# Patient Record
Sex: Male | Born: 1955 | Race: White | Hispanic: No | Marital: Married | Smoking: Never smoker
Health system: Southern US, Community
[De-identification: ages and names within clinical notes are randomized; demographics above are authoritative.]

## PROBLEM LIST (undated history)

## (undated) DIAGNOSIS — N400 Enlarged prostate without lower urinary tract symptoms: Secondary | ICD-10-CM

## (undated) DIAGNOSIS — R351 Nocturia: Secondary | ICD-10-CM

## (undated) DIAGNOSIS — I10 Essential (primary) hypertension: Secondary | ICD-10-CM

## (undated) DIAGNOSIS — N320 Bladder-neck obstruction: Secondary | ICD-10-CM

## (undated) DIAGNOSIS — I719 Aortic aneurysm of unspecified site, without rupture: Secondary | ICD-10-CM

## (undated) DIAGNOSIS — E7800 Pure hypercholesterolemia, unspecified: Secondary | ICD-10-CM

## (undated) DIAGNOSIS — R3915 Urgency of urination: Secondary | ICD-10-CM

## (undated) DIAGNOSIS — R350 Frequency of micturition: Secondary | ICD-10-CM

## (undated) DIAGNOSIS — N401 Enlarged prostate with lower urinary tract symptoms: Secondary | ICD-10-CM

## (undated) DIAGNOSIS — N138 Other obstructive and reflux uropathy: Secondary | ICD-10-CM

## (undated) HISTORY — DX: Essential (primary) hypertension: I10

---

## 1990-10-01 HISTORY — PX: LUMBAR DISC SURGERY: SHX700

## 2006-11-22 ENCOUNTER — Ambulatory Visit (HOSPITAL_COMMUNITY): Admission: RE | Admit: 2006-11-22 | Discharge: 2006-11-22 | Payer: Self-pay | Admitting: Internal Medicine

## 2006-11-22 ENCOUNTER — Ambulatory Visit (HOSPITAL_COMMUNITY): Admission: RE | Admit: 2006-11-22 | Discharge: 2006-11-22 | Payer: Self-pay | Admitting: *Deleted

## 2007-01-15 ENCOUNTER — Ambulatory Visit (HOSPITAL_COMMUNITY): Admission: RE | Admit: 2007-01-15 | Discharge: 2007-01-15 | Payer: Self-pay | Admitting: Orthopedic Surgery

## 2010-10-22 ENCOUNTER — Encounter: Payer: Self-pay | Admitting: Orthopedic Surgery

## 2013-01-09 ENCOUNTER — Ambulatory Visit (INDEPENDENT_AMBULATORY_CARE_PROVIDER_SITE_OTHER): Payer: 59 | Admitting: Urology

## 2013-01-09 DIAGNOSIS — R351 Nocturia: Secondary | ICD-10-CM

## 2013-01-09 DIAGNOSIS — N401 Enlarged prostate with lower urinary tract symptoms: Secondary | ICD-10-CM

## 2013-01-09 DIAGNOSIS — R350 Frequency of micturition: Secondary | ICD-10-CM

## 2013-03-30 ENCOUNTER — Other Ambulatory Visit: Payer: Self-pay | Admitting: Urology

## 2013-05-29 ENCOUNTER — Ambulatory Visit (INDEPENDENT_AMBULATORY_CARE_PROVIDER_SITE_OTHER): Payer: 59 | Admitting: Urology

## 2013-05-29 DIAGNOSIS — N401 Enlarged prostate with lower urinary tract symptoms: Secondary | ICD-10-CM

## 2013-06-02 ENCOUNTER — Encounter (HOSPITAL_BASED_OUTPATIENT_CLINIC_OR_DEPARTMENT_OTHER): Payer: Self-pay | Admitting: *Deleted

## 2013-06-02 NOTE — Progress Notes (Signed)
NPO AFTER MN. ARRIVES AT 0800. NEEDS HG.

## 2013-06-02 NOTE — Progress Notes (Deleted)
06/02/13 1546  OBSTRUCTIVE SLEEP APNEA  Have you ever been diagnosed with sleep apnea through a sleep study? No  Do you snore loudly (loud enough to be heard through closed doors)?  1  Do you often feel tired, fatigued, or sleepy during the daytime? 1  Has anyone observed you stop breathing during your sleep? 0  Do you have, or are you being treated for high blood pressure? 0  BMI more than 35 kg/m2? 0  Age over 57 years old? 1  Neck circumference greater than 40 cm/18 inches? 0  Gender: 1  Obstructive Sleep Apnea Score 4   

## 2013-06-09 NOTE — H&P (Signed)
  ive Problems 1. Benign Prostatic Hypertrophy With Urinary Obstruction 600.01 2. Incomplete Emptying Of Bladder 788.21 3. Nocturia 788.43 4. Organic Impotence 607.84 5. Urinary Frequency 788.41  History of Present Illness  [name] [name] returns today in f/u for a preop visit prior to [facility] prostatectomy scheduled for 9/11.   He has had no hematuria or dysuria.   His voiding complaints are stable.   Past Medical History 1. History of  No Medical Problems  Surgical History 1. History of  Back Surgery  Current Meds 1. No Reported Medications  Allergies 1. No Known Drug Allergies  Family History 1. Family history of  Family Health Status Number Of Children  Social History 1. Alcohol Use wine 2. Caffeine Use 2 cups 3. Marital History - Currently Married 4. Never A Smoker 5. Occupation: self employed Scientist, product/process development, constitutional, skin, eye, otolaryngeal, hematologic/lymphatic, cardiovascular, pulmonary, endocrine, musculoskeletal, gastrointestinal, neurological and psychiatric system(s) were reviewed and pertinent findings if present are noted.  Cardiovascular: no chest pain.  Respiratory: no shortness of breath.    Vitals Vital Signs [Data Includes: Last 1 Day]  29Aug2014 09:44AM  Blood Pressure: 139 / 84 Temperature: 98.5 F Heart Rate: 56  Physical Exam Constitutional: Well nourished and well developed . No acute distress.  Pulmonary: No respiratory distress and normal respiratory rhythm and effort.  Cardiovascular: Heart rate and rhythm are normal . No peripheral edema.    Results/Data Urine [Data Includes: Last 1 Day]   29Aug2014  COLOR YELLOW   APPEARANCE CLEAR   SPECIFIC GRAVITY 1.020   pH 6.5   GLUCOSE NEG mg/dL  BILIRUBIN NEG   KETONE NEG mg/dL  BLOOD NEG   PROTEIN NEG mg/dL  UROBILINOGEN 0.2 mg/dL  NITRITE NEG   LEUKOCYTE ESTERASE NEG    Assessment 1. Benign Prostatic Hypertrophy With Urinary Obstruction 600.01 2.  Incomplete Emptying Of Bladder 788.21   He has stable voiding symptoms and is ready to proceed with the Greenlight procedure.   Plan Organic Impotence (607.84)  1. UA With REFLEX  Done: 29Aug2014 09:53AM   I reviewed the procedure and side effects and answered his questions to his satisfaction.  Surgery is scheduled for 9/11.

## 2013-06-11 ENCOUNTER — Ambulatory Visit (HOSPITAL_BASED_OUTPATIENT_CLINIC_OR_DEPARTMENT_OTHER): Payer: 59 | Admitting: Anesthesiology

## 2013-06-11 ENCOUNTER — Ambulatory Visit (HOSPITAL_BASED_OUTPATIENT_CLINIC_OR_DEPARTMENT_OTHER)
Admission: RE | Admit: 2013-06-11 | Discharge: 2013-06-11 | Disposition: A | Discharge status: Home / Self Care | Payer: 59 | Source: Ambulatory Visit | Attending: Urology | Admitting: Urology

## 2013-06-11 ENCOUNTER — Encounter (HOSPITAL_BASED_OUTPATIENT_CLINIC_OR_DEPARTMENT_OTHER)
Admission: RE | Disposition: A | Discharge status: Home / Self Care | Payer: Self-pay | Source: Ambulatory Visit | Attending: Urology

## 2013-06-11 ENCOUNTER — Encounter (HOSPITAL_BASED_OUTPATIENT_CLINIC_OR_DEPARTMENT_OTHER): Payer: Self-pay | Admitting: Anesthesiology

## 2013-06-11 DIAGNOSIS — R339 Retention of urine, unspecified: Secondary | ICD-10-CM | POA: Insufficient documentation

## 2013-06-11 DIAGNOSIS — N320 Bladder-neck obstruction: Secondary | ICD-10-CM | POA: Insufficient documentation

## 2013-06-11 DIAGNOSIS — N138 Other obstructive and reflux uropathy: Secondary | ICD-10-CM | POA: Insufficient documentation

## 2013-06-11 DIAGNOSIS — N529 Male erectile dysfunction, unspecified: Secondary | ICD-10-CM | POA: Insufficient documentation

## 2013-06-11 DIAGNOSIS — R351 Nocturia: Secondary | ICD-10-CM | POA: Insufficient documentation

## 2013-06-11 DIAGNOSIS — N401 Enlarged prostate with lower urinary tract symptoms: Secondary | ICD-10-CM

## 2013-06-11 DIAGNOSIS — R350 Frequency of micturition: Secondary | ICD-10-CM | POA: Insufficient documentation

## 2013-06-11 HISTORY — DX: Benign prostatic hyperplasia with lower urinary tract symptoms: N40.1

## 2013-06-11 HISTORY — DX: Urgency of urination: R39.15

## 2013-06-11 HISTORY — DX: Bladder-neck obstruction: N32.0

## 2013-06-11 HISTORY — DX: Benign prostatic hyperplasia without lower urinary tract symptoms: N40.0

## 2013-06-11 HISTORY — DX: Frequency of micturition: R35.0

## 2013-06-11 HISTORY — PX: GREEN LIGHT LASER TURP (TRANSURETHRAL RESECTION OF PROSTATE: SHX6260

## 2013-06-11 HISTORY — DX: Other obstructive and reflux uropathy: N13.8

## 2013-06-11 HISTORY — DX: Nocturia: R35.1

## 2013-06-11 SURGERY — GREEN LIGHT LASER TURP (TRANSURETHRAL RESECTION OF PROSTATE
Anesthesia: General | Site: Prostate | Wound class: Clean Contaminated

## 2013-06-11 MED ORDER — SODIUM CHLORIDE 0.9 % IR SOLN
Status: DC | PRN
  Administered 2013-06-11: 9000 mL

## 2013-06-11 MED ORDER — LIDOCAINE HCL (CARDIAC) 20 MG/ML IV SOLN
INTRAVENOUS | Status: DC | PRN
  Administered 2013-06-11: 100 mg via INTRAVENOUS

## 2013-06-11 MED ORDER — PROPOFOL 10 MG/ML IV BOLUS
INTRAVENOUS | Status: DC | PRN
  Administered 2013-06-11: 200 mg via INTRAVENOUS

## 2013-06-11 MED ORDER — FENTANYL CITRATE 0.05 MG/ML IJ SOLN
INTRAMUSCULAR | Status: DC | PRN
  Administered 2013-06-11 (×2): 50 ug via INTRAVENOUS

## 2013-06-11 MED ORDER — CIPROFLOXACIN IN D5W 400 MG/200ML IV SOLN
400.0000 mg | INTRAVENOUS | Status: AC
  Administered 2013-06-11: 400 mg via INTRAVENOUS
  Filled 2013-06-11: qty 200

## 2013-06-11 MED ORDER — SODIUM CHLORIDE 0.9 % IR SOLN
Status: DC | PRN
  Administered 2013-06-11: 1000 mL

## 2013-06-11 MED ORDER — FENTANYL CITRATE 0.05 MG/ML IJ SOLN
25.0000 ug | INTRAMUSCULAR | Status: DC | PRN
  Filled 2013-06-11: qty 1

## 2013-06-11 MED ORDER — SODIUM CHLORIDE 0.9 % IJ SOLN
3.0000 mL | INTRAMUSCULAR | Status: DC | PRN
  Filled 2013-06-11: qty 3

## 2013-06-11 MED ORDER — ONDANSETRON HCL 4 MG/2ML IJ SOLN
4.0000 mg | Freq: Four times a day (QID) | INTRAMUSCULAR | Status: DC | PRN
  Filled 2013-06-11: qty 2

## 2013-06-11 MED ORDER — LACTATED RINGERS IV SOLN
INTRAVENOUS | Status: DC
  Administered 2013-06-11: 09:00:00 via INTRAVENOUS
  Filled 2013-06-11: qty 1000

## 2013-06-11 MED ORDER — MIDAZOLAM HCL 5 MG/5ML IJ SOLN
INTRAMUSCULAR | Status: DC | PRN
  Administered 2013-06-11: 2 mg via INTRAVENOUS

## 2013-06-11 MED ORDER — ACETAMINOPHEN 325 MG PO TABS
650.0000 mg | ORAL_TABLET | ORAL | Status: DC | PRN
  Filled 2013-06-11: qty 2

## 2013-06-11 MED ORDER — SODIUM CHLORIDE 0.9 % IV SOLN
250.0000 mL | INTRAVENOUS | Status: DC | PRN
  Filled 2013-06-11: qty 250

## 2013-06-11 MED ORDER — ACETAMINOPHEN 650 MG RE SUPP
650.0000 mg | RECTAL | Status: DC | PRN
  Filled 2013-06-11: qty 1

## 2013-06-11 MED ORDER — SODIUM CHLORIDE 0.9 % IJ SOLN
3.0000 mL | Freq: Two times a day (BID) | INTRAMUSCULAR | Status: DC
  Filled 2013-06-11: qty 3

## 2013-06-11 MED ORDER — OXYCODONE HCL 5 MG PO TABS
5.0000 mg | ORAL_TABLET | ORAL | Status: DC | PRN
Start: 2013-06-11 — End: 2013-06-11
  Filled 2013-06-11: qty 2

## 2013-06-11 MED ORDER — CIPROFLOXACIN HCL 500 MG PO TABS: 500.0000 mg | ORAL_TABLET | Freq: Two times a day (BID) | ORAL | Status: DC

## 2013-06-11 MED ORDER — BELLADONNA ALKALOIDS-OPIUM 16.2-60 MG RE SUPP
RECTAL | Status: DC | PRN
  Administered 2013-06-11: 1 via RECTAL

## 2013-06-11 MED ORDER — HYDROCODONE-ACETAMINOPHEN 5-325 MG PO TABS: 1.0000 | ORAL_TABLET | Freq: Four times a day (QID) | ORAL | Status: DC | PRN

## 2013-06-11 MED ORDER — LACTATED RINGERS IV SOLN
INTRAVENOUS | Status: DC
  Filled 2013-06-11: qty 1000

## 2013-06-11 MED ORDER — ONDANSETRON HCL 4 MG/2ML IJ SOLN
INTRAMUSCULAR | Status: DC | PRN
  Administered 2013-06-11: 4 mg via INTRAVENOUS

## 2013-06-11 MED ORDER — GLYCOPYRROLATE 0.2 MG/ML IJ SOLN
INTRAMUSCULAR | Status: DC | PRN
  Administered 2013-06-11: 0.2 mg via INTRAVENOUS

## 2013-06-11 MED ORDER — DEXAMETHASONE SODIUM PHOSPHATE 4 MG/ML IJ SOLN
INTRAMUSCULAR | Status: DC | PRN
  Administered 2013-06-11: 10 mg via INTRAVENOUS

## 2013-06-11 SURGICAL SUPPLY — 25 items
BAG URINE DRAINAGE (UROLOGICAL SUPPLIES) IMPLANT
BAG URINE LEG 500ML (DRAIN) ×2 IMPLANT
BAG URO CATCHER STRL LF (DRAPE) ×2 IMPLANT
CANISTER SUCT LVC 12 LTR MEDI- (MISCELLANEOUS) ×2 IMPLANT
CATH FOLEY 2WAY SLVR 30CC 22FR (CATHETERS) ×2 IMPLANT
CLOTH BEACON ORANGE TIMEOUT ST (SAFETY) ×2 IMPLANT
DRAPE CAMERA CLOSED 9X96 (DRAPES) ×2 IMPLANT
ELECT BUTTON HF 24-28F 2 30DE (ELECTRODE) IMPLANT
ELECT LOOP MED HF 24F 12D (CUTTING LOOP) IMPLANT
ELECT LOOP MED HF 24F 12D CBL (CLIP) IMPLANT
ELECT RESECT VAPORIZE 12D CBL (ELECTRODE) IMPLANT
GLOVE BIOGEL PI IND STRL 6.5 (GLOVE) ×1 IMPLANT
GLOVE BIOGEL PI INDICATOR 6.5 (GLOVE) ×1
GLOVE ECLIPSE 6.5 STRL STRAW (GLOVE) ×2 IMPLANT
GLOVE SURG SS PI 8.0 STRL IVOR (GLOVE) ×2 IMPLANT
GOWN STRL REIN XL XLG (GOWN DISPOSABLE) ×2 IMPLANT
HOLDER FOLEY CATH W/STRAP (MISCELLANEOUS) IMPLANT
IV NS 1000ML (IV SOLUTION) ×1
IV NS 1000ML BAXH (IV SOLUTION) ×1 IMPLANT
IV NS IRRIG 3000ML ARTHROMATIC (IV SOLUTION) ×6 IMPLANT
LASER FIBER /GREENLIGHT LASER (Laser) ×2 IMPLANT
LASER GREENLIGHT RENTAL P/PROC (Laser) ×2 IMPLANT
PACK CYSTOSCOPY (CUSTOM PROCEDURE TRAY) ×2 IMPLANT
SYR 30ML LL (SYRINGE) IMPLANT
SYRINGE IRR TOOMEY STRL 70CC (SYRINGE) IMPLANT

## 2013-06-11 NOTE — Anesthesia Postprocedure Evaluation (Signed)
  Anesthesia Post-op Note  Patient: [name] [name]  Procedure(s) Performed: Procedure(s) (LRB): GREEN LIGHT LASER TURP (TRANSURETHRAL RESECTION OF PROSTATE (N/A)  Patient Location: PACU  Anesthesia Type: General  Level of Consciousness: awake and alert   Airway and Oxygen Therapy: Patient Spontanous Breathing  Post-op Pain: mild  Post-op Assessment: Post-op Vital signs reviewed, Patient's Cardiovascular Status Stable, Respiratory Function Stable, Patent Airway and No signs of Nausea or vomiting  Last Vitals:  Filed Vitals:   06/11/13 1043  BP: 132/83  Pulse: 57  Temp: 36.1 C  Resp: 9    Post-op Vital Signs: stable   Complications: No apparent anesthesia complications

## 2013-06-11 NOTE — Brief Op Note (Signed)
06/11/2013  10:29 AM  PATIENT:  [name] [name]  57 y.o. male  PRE-OPERATIVE DIAGNOSIS:  BPH WITH BLADDER OUTLET OBSTRUCTION HYPERTROPHY  POST-OPERATIVE DIAGNOSIS:  BPH WITH BLADDER OUTLET OBSTRUCTION HYPERTROPHY  PROCEDURE:  Procedure(s): GREEN LIGHT LASER TURP (TRANSURETHRAL RESECTION OF PROSTATE (N/A) 18.19 min of laser time with 16109 joules applied.   SURGEON:  Surgeon(s) and Role:    * Anner Crete, MD - Primary  PHYSICIAN ASSISTANT:   ASSISTANTS: none   ANESTHESIA:   general  EBL:  Total I/O In: 200 [I.V.:200] Out: -   BLOOD ADMINISTERED:none  DRAINS: Urinary Catheter (Foley)   LOCAL MEDICATIONS USED:  NONE  SPECIMEN:  No Specimen  DISPOSITION OF SPECIMEN:  N/A  COUNTS:  YES  TOURNIQUET:  * No tourniquets in log *  DICTATION: .Other Dictation: Dictation Number [id number]  PLAN OF CARE: Discharge to home after PACU  PATIENT DISPOSITION:  PACU - hemodynamically stable.   Delay start of Pharmacological VTE agent (>24hrs) due to surgical blood loss or risk of bleeding: not applicable

## 2013-06-11 NOTE — Progress Notes (Signed)
06/02/13 1546  OBSTRUCTIVE SLEEP APNEA  Have you ever been diagnosed with sleep apnea through a sleep study? No  Do you snore loudly (loud enough to be heard through closed doors)?  1  Do you often feel tired, fatigued, or sleepy during the daytime? 1  Has anyone observed you stop breathing during your sleep? 0  Do you have, or are you being treated for high blood pressure? 0  BMI more than 35 kg/m2? 0  Age over 57 years old? 1  Neck circumference greater than 40 cm/18 inches? 0  Gender: 1  Obstructive Sleep Apnea Score 4

## 2013-06-11 NOTE — Op Note (Deleted)
NAME:  [name], [name]               ACCOUNT NO.:  [id number]  MEDICAL RECORD NO.:  [id number]  LOCATION:                                 FACILITY:  PHYSICIAN:  [name] [name] J. [name] [name], M.D.    DATE OF BIRTH:  06/30/1956  DATE OF PROCEDURE:  06/11/2013 DATE OF DISCHARGE:  06/11/2013                              OPERATIVE REPORT   The patient of Dr. Everett [name].  PROCEDURE:  GreenLight laser prostatectomy.  PREOPERATIVE DIAGNOSIS:  Benign prostatic hypertrophy with bladder outlet obstruction.  POSTOPERATIVE DIAGNOSIS:  Benign prostatic hypertrophy with bladder outlet obstruction.  SURGEON:  [name] [name] J. [name] [name], M.D.  ANESTHESIA:  General.  SPECIMEN:  None.  DRAINS:  A 22-French Foley catheter.  COMPLICATIONS:  None.  INDICATIONS:  [name] [name] is a 56-year-old white male with BPH, bladder outlet obstruction, who has elected to undergo green light laser prostatectomy for relief of symptoms, findings, and procedures.  He was given Cipro.  He was taken to the operating room where general anesthetic was induced.  He was placed in lithotomy position.  His perineum and genitalia were prepped with Betadine solution.  He was draped in usual sterile fashion.  The 24-French laser scope was inserted with a 12-degree lens. Inspection revealed normal urethra.  The external sphincter was intact. The prostatic urethra had trilobar hyperplasia with a moderately large middle lobe and bilobar coaptation.  Prostatic length was approximately 3-4 cm.  Inspection of the bladder revealed mild trabeculation without tumors, stones, or inflammation.  Ureteral orifices were difficult to visualize behind the middle lobe, but were eventually found.  Once inspection had been performed, the GreenLight laser fiber was flushed and inserted, and treatment was initiated at a power setting of 80 watts.  The middle lobe was initially treated most proximally with the 80 watt setting to define the bladder neck.  I then  increased the power to 120 watts and then ablated the middle lobe down to the bladder neck fibers.  The floor of the prostate was then ablated out to alongside the verumontanum.  The right lobe of the prostate was then approached.  The setting was changed back to 80, and the bladder neck was defined.  The right lobe was then ablated to create an adequate channel on the right out to the apex where great care was taken to avoid going to distally.  The left lobe was then managed in identical fashion.  Once an adequate channel was created.  Inspection revealed some residual anterior tissue, which was ablated on the 80 watt setting.  Throughout the procedure, occasional bleeders required management with the coag setting.  The final approach was to further flatten and carve out the floor of the prostate gland to ensure an adequate channel.  Once the procedure was complete, hemostasis was achieved.  Inspection revealed intact ureteral orifices.  No debris in the bladder and no active bleeding.  A 22-French Foley catheter was in place with the aid of a catheter guide.  The balloon was filled with 30 mL of sterile fluid.  The catheter was irrigated with clear return and placed to straight drainage.  The patient was taken down from lithotomy position.    His anesthetic was reversed.  He was moved to recovery room in stable condition.  He did have a B and O suppository at beginning of procedure.     [name] [name] [name] [name], M.D.     JJW/MEDQ  D:  06/11/2013  T:  06/11/2013  Job:  [id number] 

## 2013-06-11 NOTE — Anesthesia Procedure Notes (Signed)
Procedure Name: LMA Insertion Date/Time: 06/11/2013 9:31 AM Performed by: Norva Pavlov Pre-anesthesia Checklist: Patient identified, Emergency Drugs available, Suction available and Patient being monitored Patient Re-evaluated:Patient Re-evaluated prior to inductionOxygen Delivery Method: Circle System Utilized Preoxygenation: Pre-oxygenation with 100% oxygen Intubation Type: IV induction Ventilation: Mask ventilation without difficulty LMA: LMA inserted LMA Size: 5.0 Number of attempts: 1 Airway Equipment and Method: bite block Placement Confirmation: positive ETCO2 Tube secured with: Tape Dental Injury: Teeth and Oropharynx as per pre-operative assessment

## 2013-06-11 NOTE — Interval H&P Note (Signed)
History and Physical Interval Note:  06/11/2013 9:22 AM  [name] [name]  has presented today for surgery, with the diagnosis of BPH WITH BLADDER OUTLET OBSTRUCTION HYPERTROPHY  The various methods of treatment have been discussed with the patient and family. After consideration of risks, benefits and other options for treatment, the patient has consented to  Procedure(s): GREEN LIGHT LASER TURP (TRANSURETHRAL RESECTION OF PROSTATE (N/A) as a surgical intervention .  The patient's history has been reviewed, patient examined, no change in status, stable for surgery.  I have reviewed the patient's chart and labs.  Questions were answered to the patient's satisfaction.     Kennley Schwandt J

## 2013-06-11 NOTE — Progress Notes (Deleted)
06/11/13 0834  OBSTRUCTIVE SLEEP APNEA  Have you ever been diagnosed with sleep apnea through a sleep study? No  Do you snore loudly (loud enough to be heard through closed doors)?  1  Do you often feel tired, fatigued, or sleepy during the daytime? 0  Has anyone observed you stop breathing during your sleep? 1  Do you have, or are you being treated for high blood pressure? 0  BMI more than 35 kg/m2? 0  Age over 57 years old? 1  Neck circumference greater than 40 cm/18 inches? 0  Gender: 1  Obstructive Sleep Apnea Score 4  Score 4 or greater  Results sent to PCP

## 2013-06-11 NOTE — Op Note (Signed)
NAMEYUAN, GANN               ACCOUNT NO.:  [id number]  MEDICAL RECORD NO.:  [id number]  LOCATION:                                 FACILITY:  PHYSICIAN:  Excell Seltzer. Annabell Howells, M.D.    DATE OF BIRTH:  30-Aug-1956  DATE OF PROCEDURE:  06/11/2013 DATE OF DISCHARGE:  06/11/2013                              OPERATIVE REPORT   The patient of Dr. Bjorn Pippin.  PROCEDURE:  GreenLight laser prostatectomy.  PREOPERATIVE DIAGNOSIS:  Benign prostatic hypertrophy with bladder outlet obstruction.  POSTOPERATIVE DIAGNOSIS:  Benign prostatic hypertrophy with bladder outlet obstruction.  SURGEON:  Excell Seltzer. Annabell Howells, M.D.  ANESTHESIA:  General.  SPECIMEN:  None.  DRAINS:  A 22-French Foley catheter.  COMPLICATIONS:  None.  INDICATIONS:  Mr. Gains is a 57 year old white male with BPH, bladder outlet obstruction, who has elected to undergo green light laser prostatectomy for relief of symptoms, findings, and procedures.  He was given Cipro.  He was taken to the operating room where general anesthetic was induced.  He was placed in lithotomy position.  His perineum and genitalia were prepped with Betadine solution.  He was draped in usual sterile fashion.  The 24-French laser scope was inserted with a 12-degree lens. Inspection revealed normal urethra.  The external sphincter was intact. The prostatic urethra had trilobar hyperplasia with a moderately large middle lobe and bilobar coaptation.  Prostatic length was approximately 3-4 cm.  Inspection of the bladder revealed mild trabeculation without tumors, stones, or inflammation.  Ureteral orifices were difficult to visualize behind the middle lobe, but were eventually found.  Once inspection had been performed, the GreenLight laser fiber was flushed and inserted, and treatment was initiated at a power setting of 80 watts.  The middle lobe was initially treated most proximally with the 80 watt setting to define the bladder neck.  I then  increased the power to 120 watts and then ablated the middle lobe down to the bladder neck fibers.  The floor of the prostate was then ablated out to alongside the verumontanum.  The right lobe of the prostate was then approached.  The setting was changed back to 80, and the bladder neck was defined.  The right lobe was then ablated to create an adequate channel on the right out to the apex where great care was taken to avoid going to distally.  The left lobe was then managed in identical fashion.  Once an adequate channel was created.  Inspection revealed some residual anterior tissue, which was ablated on the 80 watt setting.  Throughout the procedure, occasional bleeders required management with the coag setting.  The final approach was to further flatten and carve out the floor of the prostate gland to ensure an adequate channel.  Once the procedure was complete, hemostasis was achieved.  Inspection revealed intact ureteral orifices.  No debris in the bladder and no active bleeding.  A 22-French Foley catheter was in place with the aid of a catheter guide.  The balloon was filled with 30 mL of sterile fluid.  The catheter was irrigated with clear return and placed to straight drainage.  The patient was taken down from lithotomy position.  His anesthetic was reversed.  He was moved to recovery room in stable condition.  He did have a B and O suppository at beginning of procedure.     Excell Seltzer. Annabell Howells, M.D.     JJW/MEDQ  D:  06/11/2013  T:  06/11/2013  Job:  [id number]

## 2013-06-11 NOTE — Anesthesia Preprocedure Evaluation (Signed)

## 2013-06-11 NOTE — Transfer of Care (Signed)
Immediate Anesthesia Transfer of Care Note  Patient: [name] [name]  Procedure(s) Performed: Procedure(s) (LRB): GREEN LIGHT LASER TURP (TRANSURETHRAL RESECTION OF PROSTATE (N/A)  Patient Location: PACU  Anesthesia Type: General  Level of Consciousness: awake, alert  and oriented  Airway & Oxygen Therapy: Patient Spontanous Breathing and Patient connected to face mask oxygen  Post-op Assessment: Report given to PACU RN and Post -op Vital signs reviewed and stable  Post vital signs: Reviewed and stable  Complications: No apparent anesthesia complications

## 2013-06-12 ENCOUNTER — Encounter (HOSPITAL_BASED_OUTPATIENT_CLINIC_OR_DEPARTMENT_OTHER): Payer: Self-pay | Admitting: Urology

## 2013-06-12 LAB — POCT HEMOGLOBIN-HEMACUE: Hemoglobin: 15.5 g/dL (ref 13.0–17.0)

## 2016-01-18 DIAGNOSIS — Z125 Encounter for screening for malignant neoplasm of prostate: Secondary | ICD-10-CM | POA: Diagnosis not present

## 2016-01-18 DIAGNOSIS — R5383 Other fatigue: Secondary | ICD-10-CM | POA: Diagnosis not present

## 2016-01-18 DIAGNOSIS — Z131 Encounter for screening for diabetes mellitus: Secondary | ICD-10-CM | POA: Diagnosis not present

## 2016-01-18 DIAGNOSIS — E291 Testicular hypofunction: Secondary | ICD-10-CM | POA: Diagnosis not present

## 2016-01-18 DIAGNOSIS — E559 Vitamin D deficiency, unspecified: Secondary | ICD-10-CM | POA: Diagnosis not present

## 2016-01-18 DIAGNOSIS — Z0389 Encounter for observation for other suspected diseases and conditions ruled out: Secondary | ICD-10-CM | POA: Diagnosis not present

## 2016-01-18 DIAGNOSIS — Z0000 Encounter for general adult medical examination without abnormal findings: Secondary | ICD-10-CM | POA: Diagnosis not present

## 2016-01-18 DIAGNOSIS — Z13220 Encounter for screening for lipoid disorders: Secondary | ICD-10-CM | POA: Diagnosis not present

## 2016-02-21 DIAGNOSIS — E559 Vitamin D deficiency, unspecified: Secondary | ICD-10-CM | POA: Diagnosis not present

## 2016-02-21 DIAGNOSIS — E291 Testicular hypofunction: Secondary | ICD-10-CM | POA: Diagnosis not present

## 2016-02-21 DIAGNOSIS — R5383 Other fatigue: Secondary | ICD-10-CM | POA: Diagnosis not present

## 2016-04-17 DIAGNOSIS — R5383 Other fatigue: Secondary | ICD-10-CM | POA: Diagnosis not present

## 2016-04-17 DIAGNOSIS — E291 Testicular hypofunction: Secondary | ICD-10-CM | POA: Diagnosis not present

## 2016-07-17 DIAGNOSIS — Z23 Encounter for immunization: Secondary | ICD-10-CM | POA: Diagnosis not present

## 2016-08-16 DIAGNOSIS — E785 Hyperlipidemia, unspecified: Secondary | ICD-10-CM | POA: Diagnosis not present

## 2016-08-16 DIAGNOSIS — E291 Testicular hypofunction: Secondary | ICD-10-CM | POA: Diagnosis not present

## 2016-08-16 DIAGNOSIS — Z125 Encounter for screening for malignant neoplasm of prostate: Secondary | ICD-10-CM | POA: Diagnosis not present

## 2016-08-16 DIAGNOSIS — E559 Vitamin D deficiency, unspecified: Secondary | ICD-10-CM | POA: Diagnosis not present

## 2016-08-16 DIAGNOSIS — Z713 Dietary counseling and surveillance: Secondary | ICD-10-CM | POA: Diagnosis not present

## 2016-08-16 DIAGNOSIS — Z13220 Encounter for screening for lipoid disorders: Secondary | ICD-10-CM | POA: Diagnosis not present

## 2016-11-01 MED FILL — AMOXICILLIN 875 MG TABLET: 875 | 7 days supply | Qty: 14 | Fill #0

## 2016-11-07 MED FILL — CLINDAMYCIN HCL 300 MG CAPS: 300 | 10 days supply | Qty: 30 | Fill #0

## 2016-11-22 DIAGNOSIS — Z7689 Persons encountering health services in other specified circumstances: Secondary | ICD-10-CM | POA: Diagnosis not present

## 2016-12-04 DIAGNOSIS — Z8249 Family history of ischemic heart disease and other diseases of the circulatory system: Secondary | ICD-10-CM | POA: Diagnosis not present

## 2016-12-04 DIAGNOSIS — Z23 Encounter for immunization: Secondary | ICD-10-CM | POA: Diagnosis not present

## 2016-12-04 DIAGNOSIS — Z0000 Encounter for general adult medical examination without abnormal findings: Secondary | ICD-10-CM | POA: Diagnosis not present

## 2016-12-04 DIAGNOSIS — H6123 Impacted cerumen, bilateral: Secondary | ICD-10-CM | POA: Diagnosis not present

## 2016-12-04 DIAGNOSIS — Z1212 Encounter for screening for malignant neoplasm of rectum: Secondary | ICD-10-CM | POA: Diagnosis not present

## 2016-12-04 DIAGNOSIS — N401 Enlarged prostate with lower urinary tract symptoms: Secondary | ICD-10-CM | POA: Diagnosis not present

## 2016-12-11 DIAGNOSIS — E291 Testicular hypofunction: Secondary | ICD-10-CM | POA: Diagnosis not present

## 2016-12-11 DIAGNOSIS — Z1211 Encounter for screening for malignant neoplasm of colon: Secondary | ICD-10-CM | POA: Diagnosis not present

## 2016-12-11 DIAGNOSIS — Z1212 Encounter for screening for malignant neoplasm of rectum: Secondary | ICD-10-CM | POA: Diagnosis not present

## 2017-04-15 DIAGNOSIS — E291 Testicular hypofunction: Secondary | ICD-10-CM | POA: Diagnosis not present

## 2017-04-15 DIAGNOSIS — Z125 Encounter for screening for malignant neoplasm of prostate: Secondary | ICD-10-CM | POA: Diagnosis not present

## 2017-04-15 DIAGNOSIS — E559 Vitamin D deficiency, unspecified: Secondary | ICD-10-CM | POA: Diagnosis not present

## 2017-04-15 DIAGNOSIS — R5383 Other fatigue: Secondary | ICD-10-CM | POA: Diagnosis not present

## 2017-08-13 DIAGNOSIS — H524 Presbyopia: Secondary | ICD-10-CM | POA: Diagnosis not present

## 2017-08-13 DIAGNOSIS — H5203 Hypermetropia, bilateral: Secondary | ICD-10-CM | POA: Diagnosis not present

## 2017-08-13 DIAGNOSIS — H52223 Regular astigmatism, bilateral: Secondary | ICD-10-CM | POA: Diagnosis not present

## 2017-10-16 DIAGNOSIS — R5383 Other fatigue: Secondary | ICD-10-CM | POA: Diagnosis not present

## 2017-10-16 DIAGNOSIS — R972 Elevated prostate specific antigen [PSA]: Secondary | ICD-10-CM | POA: Diagnosis not present

## 2017-10-30 DIAGNOSIS — R972 Elevated prostate specific antigen [PSA]: Secondary | ICD-10-CM | POA: Diagnosis not present

## 2017-11-06 MED FILL — AMOXICILLIN 500 MG CAPSULE: 500 | 7 days supply | Qty: 28 | Fill #0

## 2018-03-24 DIAGNOSIS — L0390 Cellulitis, unspecified: Secondary | ICD-10-CM | POA: Diagnosis not present

## 2018-04-16 DIAGNOSIS — R972 Elevated prostate specific antigen [PSA]: Secondary | ICD-10-CM | POA: Diagnosis not present

## 2018-04-16 DIAGNOSIS — E291 Testicular hypofunction: Secondary | ICD-10-CM | POA: Diagnosis not present

## 2018-10-20 MED FILL — AMOXICILLIN 500 MG CAPSULE: 500 | 9 days supply | Qty: 28 | Fill #0

## 2019-06-11 ENCOUNTER — Other Ambulatory Visit (INDEPENDENT_AMBULATORY_CARE_PROVIDER_SITE_OTHER): Payer: Self-pay | Admitting: Internal Medicine

## 2019-06-11 MED ORDER — TESTOSTERONE CYPIONATE 200 MG/ML IM SOLN: 100.0000 mg | INTRAMUSCULAR | 1 refills | Status: DC

## 2019-06-30 ENCOUNTER — Other Ambulatory Visit (INDEPENDENT_AMBULATORY_CARE_PROVIDER_SITE_OTHER): Payer: Self-pay | Admitting: Internal Medicine

## 2019-06-30 DIAGNOSIS — G8929 Other chronic pain: Secondary | ICD-10-CM

## 2019-06-30 NOTE — Progress Notes (Signed)
ref

## 2019-07-08 ENCOUNTER — Other Ambulatory Visit: Payer: Self-pay

## 2019-07-08 ENCOUNTER — Ambulatory Visit: Payer: No Typology Code available for payment source

## 2019-07-08 ENCOUNTER — Encounter: Payer: Self-pay | Admitting: Orthopedic Surgery

## 2019-07-08 ENCOUNTER — Ambulatory Visit: Payer: No Typology Code available for payment source | Admitting: Orthopedic Surgery

## 2019-07-08 VITALS — BP 131/87 | HR 67 | Ht 73.0 in | Wt 200.0 lb

## 2019-07-08 DIAGNOSIS — M25561 Pain in right knee: Secondary | ICD-10-CM

## 2019-07-08 DIAGNOSIS — G8929 Other chronic pain: Secondary | ICD-10-CM | POA: Diagnosis not present

## 2019-07-08 NOTE — Progress Notes (Signed)
JOBAN BHUIYAN  07/08/2019  HISTORY SECTION :  Chief Complaint  Patient presents with  . Knee Pain    right since June injury running uneven ground   63 year old male presents with a history of pain anterior aspect right knee which started in June after he was walking on some on level ground.  He says the pain seems to come and go it initially was worse when he was walking but now it is better aches at night.  He did take some Aleve a couple of times and now presents for evaluation mainly for diagnostic purposes.  Denies any swelling but did have some tightness and had trouble flexing his knee all the way initially that has resolved   Review of Systems  All other systems reviewed and are negative.    has a past medical history of Bladder outlet obstruction, BPH (benign prostatic hypertrophy), BPH (benign prostatic hypertrophy) with urinary obstruction (06/11/2013), Frequency of urination, Nocturia, and Urgency of urination.   Past Surgical History:  Procedure Laterality Date  . GREEN LIGHT LASER TURP (TRANSURETHRAL RESECTION OF PROSTATE N/A 06/11/2013   Procedure: GREEN LIGHT LASER TURP (TRANSURETHRAL RESECTION OF PROSTATE;  Surgeon: Malka So, MD;  Location: [facility];  Service: Urology;  Laterality: N/A;  . LUMBAR DISC SURGERY  1992   L4 -- L5    Body mass index is 26.39 kg/m.   No Known Allergies   Current Outpatient Medications:  .  testosterone cypionate (DEPO-TESTOSTERONE) 200 MG/ML injection, Inject 0.5 mLs (100 mg total) into the muscle 2 (two) times a week., Disp: 10 mL, Rfl: 1   PHYSICAL EXAM SECTION: 1) BP 131/87   Pulse 67   Ht 6\' 1"  (1.854 m)   Wt 200 lb (90.7 kg)   BMI 26.39 kg/m   Body mass index is 26.39 kg/m. General appearance: Well-developed well-nourished no gross deformities  2) Cardiovascular normal pulse and perfusion in the lower  extremities normal color without edema  3) Neurologically deep tendon reflexes are equal and  normal, no sensation loss or deficits no pathologic reflexes  4) Psychological: Awake alert and oriented x3 mood and affect normal  5) Skin no lacerations or ulcerations no nodularity no palpable masses, no erythema or nodularity  6) Musculoskeletal:   Left knee no tenderness no swelling full range of motion all ligaments were stable muscle tone was normal  Right knee no effusion quadriceps and patellar tendon intact entire extensor mechanism was nontender.  He did have full range of motion on this occasion with ligaments stable he did have some tenderness on the medial joint line but negative McMurray sign.  Muscle tone was normal there was no atrophy.  MEDICAL DECISION SECTION:  Encounter Diagnosis  Name Primary?  . Chronic pain of right knee Yes    Imaging In office x-rays were obtained  Plan:  (Rx., Inj., surg., Frx, MRI/CT, XR:2)  Does not seem to have meniscal signs, could have tweaked his meniscus possible.  His pain seems to be more anterior may have strained his patellar tendon  Recommend anti-inflammatories as needed return if swelling catching locking or giving way  3:20 PM Arther Abbott, MD  07/08/2019

## 2019-08-31 ENCOUNTER — Other Ambulatory Visit (INDEPENDENT_AMBULATORY_CARE_PROVIDER_SITE_OTHER): Payer: Self-pay | Admitting: Internal Medicine

## 2019-08-31 MED ORDER — TESTOSTERONE CYPIONATE 200 MG/ML IM SOLN: 100.0000 mg | INTRAMUSCULAR | 1 refills | Status: DC

## 2019-10-19 ENCOUNTER — Ambulatory Visit (INDEPENDENT_AMBULATORY_CARE_PROVIDER_SITE_OTHER): Payer: 59 | Admitting: Internal Medicine

## 2019-12-12 ENCOUNTER — Ambulatory Visit: Payer: No Typology Code available for payment source

## 2019-12-23 ENCOUNTER — Other Ambulatory Visit (INDEPENDENT_AMBULATORY_CARE_PROVIDER_SITE_OTHER): Payer: Self-pay | Admitting: Internal Medicine

## 2019-12-23 MED ORDER — TESTOSTERONE CYPIONATE 200 MG/ML IM SOLN: 100.0000 mg | INTRAMUSCULAR | 1 refills | Status: DC

## 2020-04-18 ENCOUNTER — Other Ambulatory Visit: Payer: Self-pay

## 2020-04-18 ENCOUNTER — Encounter (INDEPENDENT_AMBULATORY_CARE_PROVIDER_SITE_OTHER): Payer: Self-pay | Admitting: Internal Medicine

## 2020-04-18 ENCOUNTER — Ambulatory Visit (INDEPENDENT_AMBULATORY_CARE_PROVIDER_SITE_OTHER): Payer: No Typology Code available for payment source | Admitting: Internal Medicine

## 2020-04-18 VITALS — BP 128/70 | Temp 97.4°F | Resp 18 | Ht 73.0 in | Wt 199.0 lb

## 2020-04-18 DIAGNOSIS — R5381 Other malaise: Secondary | ICD-10-CM | POA: Diagnosis not present

## 2020-04-18 DIAGNOSIS — Z0001 Encounter for general adult medical examination with abnormal findings: Secondary | ICD-10-CM

## 2020-04-18 DIAGNOSIS — R5383 Other fatigue: Secondary | ICD-10-CM

## 2020-04-18 DIAGNOSIS — Z131 Encounter for screening for diabetes mellitus: Secondary | ICD-10-CM

## 2020-04-18 DIAGNOSIS — Z13220 Encounter for screening for lipoid disorders: Secondary | ICD-10-CM

## 2020-04-18 DIAGNOSIS — E559 Vitamin D deficiency, unspecified: Secondary | ICD-10-CM

## 2020-04-18 DIAGNOSIS — E291 Testicular hypofunction: Secondary | ICD-10-CM

## 2020-04-18 DIAGNOSIS — Z1159 Encounter for screening for other viral diseases: Secondary | ICD-10-CM

## 2020-04-18 DIAGNOSIS — Z125 Encounter for screening for malignant neoplasm of prostate: Secondary | ICD-10-CM

## 2020-04-18 NOTE — Progress Notes (Signed)
Chief Complaint: This 64 year old man comes in for an annual physical exam. HPI: He has symptoms of low testosterone and has been on testosterone therapy for several years.  He is tolerating this very well. He has no specific complaints today. He does have a history of BPH in the past but this does not seem to be a major issue at the present time.  Past Medical History:  Diagnosis Date  . Bladder outlet obstruction   . BPH (benign prostatic hypertrophy)   . BPH (benign prostatic hypertrophy) with urinary obstruction 06/11/2013  . Frequency of urination   . Nocturia   . Urgency of urination    Past Surgical History:  Procedure Laterality Date  . GREEN LIGHT LASER TURP (TRANSURETHRAL RESECTION OF PROSTATE N/A 06/11/2013   Procedure: GREEN LIGHT LASER TURP (TRANSURETHRAL RESECTION OF PROSTATE;  Surgeon: Malka So, MD;  Location: [facility];  Service: Urology;  Laterality: N/A;  . LUMBAR DISC SURGERY  1992   L4 -- L5     Social History   Social History Narrative  . Not on file    Social History   Tobacco Use  . Smoking status: Never Smoker  . Smokeless tobacco: Never Used  Substance Use Topics  . Alcohol use: Yes    Comment: OCCASIONAL      Allergies: No Known Allergies   Current Meds  Medication Sig  . testosterone cypionate (DEPO-TESTOSTERONE) 200 MG/ML injection Inject 0.5 mLs (100 mg total) into the muscle 2 (two) times a week.      Depression screen PHQ 2/9 04/18/2020  Decreased Interest 0  Down, Depressed, Hopeless 0  PHQ - 2 Score 0     STM:HDQQI from the symptoms mentioned above,there are no other symptoms referable to all systems reviewed.       Physical Exam: Blood pressure 128/70, temperature (!) 97.4 F (36.3 C), temperature source Temporal, resp. rate 18, height 6\' 1"  (1.854 m), weight 199 lb (90.3 kg). Vitals with BMI 04/18/2020 07/08/2019 06/11/2013  Height 6\' 1"  6\' 1"  -  Weight 199 lbs 200 lbs -  BMI 26.26 29.79 -    Systolic 892 119 417  Diastolic 70 87 85  Pulse - 67 74      He looks systemically well, slightly overweight.  Blood pressure is excellent. General: Alert, cooperative, and appears to be the stated age.No pallor.  No jaundice.  No clubbing. Head: Normocephalic Eyes: Sclera white, pupils equal and reactive to light, red reflex x 2,  Ears: Normal bilaterally Oral cavity: Lips, mucosa, and tongue normal: Teeth and gums normal Neck: No adenopathy, supple, symmetrical, trachea midline, and thyroid does not appear enlarged Respiratory: Clear to auscultation bilaterally.No wheezing, crackles or bronchial breathing. Cardiovascular: Heart sounds are present and appear to be normal without murmurs or added sounds.  No carotid bruits.  Peripheral pulses are present and equal bilaterally.: Gastrointestinal:positive bowel sounds, no hepatosplenomegaly.  No masses felt.No tenderness. Skin: Clear, No rashes noted.No worrisome skin lesions seen. Neurological: Grossly intact without focal findings, cranial nerves II through XII intact, muscle strength equal bilaterally Musculoskeletal: No acute joint abnormalities noted.Full range of movement noted with joints. Psychiatric: Affect appropriate, non-anxious.    Assessment  1. Testicular failure   2. Encounter for general adult medical examination with abnormal findings   3. Malaise and fatigue   4. Vitamin D deficiency disease   5. Encounter for hepatitis C screening test for low risk patient   6. Screening for lipoid disorders  7. Screening for diabetes mellitus   8. Special screening for malignant neoplasm of prostate     Tests Ordered:   Orders Placed This Encounter  Procedures  . CBC  . COMPLETE METABOLIC PANEL WITH GFR  . Lipid panel  . Hemoglobin A1c  . PSA, Total with Reflex to PSA, Free  . Testosterone Total,Free,Bio, Males  . VITAMIN D 25 Hydroxy (Vit-D Deficiency, Fractures)  . T3, free  . T4  . TSH  . Hepatitis C antibody      Plan  1. Relatively healthy 64 year old man 2. Blood work is ordered. 3. Further recommendations will depend on blood results and I will see him in about 6 months time for follow-up.     No orders of the defined types were placed in this encounter.    Damari Suastegui C Nazaiah Navarrete   04/18/2020, 9:27 AM

## 2020-04-19 LAB — CBC
HCT: 52.8 % — ABNORMAL HIGH (ref 38.5–50.0)
Hemoglobin: 17.5 g/dL — ABNORMAL HIGH (ref 13.2–17.1)
MCH: 29.8 pg (ref 27.0–33.0)
MCHC: 33.1 g/dL (ref 32.0–36.0)
MCV: 89.9 fL (ref 80.0–100.0)
MPV: 11.1 fL (ref 7.5–12.5)
Platelets: 354 10*3/uL (ref 140–400)
RBC: 5.87 10*6/uL — ABNORMAL HIGH (ref 4.20–5.80)
RDW: 14.3 % (ref 11.0–15.0)
WBC: 6.2 10*3/uL (ref 3.8–10.8)

## 2020-04-19 LAB — COMPLETE METABOLIC PANEL WITH GFR
AG Ratio: 1.8 (calc) (ref 1.0–2.5)
ALT: 16 U/L (ref 9–46)
AST: 16 U/L (ref 10–35)
Albumin: 4.2 g/dL (ref 3.6–5.1)
Alkaline phosphatase (APISO): 43 U/L (ref 35–144)
BUN: 12 mg/dL (ref 7–25)
CO2: 28 mmol/L (ref 20–32)
Calcium: 9.1 mg/dL (ref 8.6–10.3)
Chloride: 104 mmol/L (ref 98–110)
Creat: 0.97 mg/dL (ref 0.70–1.25)
GFR, Est African American: 96 mL/min/{1.73_m2} (ref >=60)
GFR, Est Non African American: 83 mL/min/{1.73_m2} (ref >=60)
Globulin: 2.3 g/dL (calc) (ref 1.9–3.7)
Glucose, Bld: 78 mg/dL (ref 65–99)
Potassium: 4.6 mmol/L (ref 3.5–5.3)
Sodium: 139 mmol/L (ref 135–146)
Total Bilirubin: 0.7 mg/dL (ref 0.2–1.2)
Total Protein: 6.5 g/dL (ref 6.1–8.1)

## 2020-04-19 LAB — PSA, TOTAL WITH REFLEX TO PSA, FREE: PSA, Total: 4.2 ng/mL — ABNORMAL HIGH (ref <=4.0)

## 2020-04-19 LAB — LIPID PANEL
Cholesterol: 173 mg/dL (ref <200)
HDL: 57 mg/dL (ref >=40)
LDL Cholesterol (Calc): 101 mg/dL (calc) — ABNORMAL HIGH
Non-HDL Cholesterol (Calc): 116 mg/dL (calc) (ref <130)
Total CHOL/HDL Ratio: 3 (calc) (ref <5.0)
Triglycerides: 63 mg/dL (ref <150)

## 2020-04-19 LAB — HEMOGLOBIN A1C
Hgb A1c MFr Bld: 4.8 % of total Hgb (ref <5.7)
Mean Plasma Glucose: 91 (calc)
eAG (mmol/L): 5 (calc)

## 2020-04-19 LAB — T4: T4, Total: 4.9 ug/dL (ref 4.9–10.5)

## 2020-04-19 LAB — HEPATITIS C ANTIBODY
Hepatitis C Ab: NONREACTIVE
SIGNAL TO CUT-OFF: 0.01 (ref <1.00)

## 2020-04-19 LAB — TESTOSTERONE TOTAL,FREE,BIO, MALES
Albumin: 4.2 g/dL (ref 3.6–5.1)
Sex Hormone Binding: 52 nmol/L (ref 22–77)
Testosterone, Bioavailable: 274.9 ng/dL (ref >=110.0)
Testosterone, Free: 142.7 pg/mL (ref 46.0–224.0)
Testosterone: 1268 ng/dL — ABNORMAL HIGH (ref 250–827)

## 2020-04-19 LAB — VITAMIN D 25 HYDROXY (VIT D DEFICIENCY, FRACTURES): Vit D, 25-Hydroxy: 99 ng/mL (ref 30–100)

## 2020-04-19 LAB — TSH: TSH: 2.14 mIU/L (ref 0.40–4.50)

## 2020-04-19 LAB — REFLEX PSA, FREE
PSA, % Free: 19 % (calc) — ABNORMAL LOW (ref >25)
PSA, Free: 0.8 ng/mL

## 2020-04-19 LAB — T3, FREE: T3, Free: 3.4 pg/mL (ref 2.3–4.2)

## 2020-04-26 ENCOUNTER — Other Ambulatory Visit (INDEPENDENT_AMBULATORY_CARE_PROVIDER_SITE_OTHER): Payer: Self-pay | Admitting: Internal Medicine

## 2020-04-26 MED ORDER — TESTOSTERONE CYPIONATE 200 MG/ML IM SOLN: 100.0000 mg | INTRAMUSCULAR | 1 refills | Status: DC

## 2020-08-03 ENCOUNTER — Other Ambulatory Visit (INDEPENDENT_AMBULATORY_CARE_PROVIDER_SITE_OTHER): Payer: Self-pay | Admitting: Internal Medicine

## 2020-08-03 MED ORDER — TESTOSTERONE CYPIONATE 200 MG/ML IM SOLN: 100.0000 mg | INTRAMUSCULAR | 1 refills | Status: DC

## 2020-08-18 ENCOUNTER — Ambulatory Visit: Payer: No Typology Code available for payment source | Attending: Internal Medicine

## 2020-08-18 DIAGNOSIS — Z23 Encounter for immunization: Secondary | ICD-10-CM

## 2020-08-18 NOTE — Progress Notes (Signed)
   Covid-19 Vaccination Clinic  Name:  [name] [name]    MRN: [id number] DOB: 1956/03/16  08/18/2020  [name] [name] was observed post Covid-19 immunization for 15 minutes without incident. He was provided with Vaccine Information Sheet and instruction to access the V-Safe system.   [name] [name] was instructed to call 911 with any severe reactions post vaccine: Marland Kitchen Difficulty breathing  . Swelling of face and throat  . A fast heartbeat  . A bad rash all over body  . Dizziness and weakness   Immunizations Administered    No immunizations on file.

## 2020-10-22 ENCOUNTER — Other Ambulatory Visit (INDEPENDENT_AMBULATORY_CARE_PROVIDER_SITE_OTHER): Payer: Self-pay | Admitting: Internal Medicine

## 2020-10-22 MED ORDER — TESTOSTERONE CYPIONATE 200 MG/ML IM SOLN: 100.0000 mg | INTRAMUSCULAR | 2 refills | Status: DC

## 2020-10-24 ENCOUNTER — Other Ambulatory Visit: Payer: Self-pay

## 2020-10-24 ENCOUNTER — Ambulatory Visit (INDEPENDENT_AMBULATORY_CARE_PROVIDER_SITE_OTHER): Payer: 59 | Admitting: Internal Medicine

## 2020-10-24 ENCOUNTER — Encounter (INDEPENDENT_AMBULATORY_CARE_PROVIDER_SITE_OTHER): Payer: Self-pay | Admitting: *Deleted

## 2020-10-24 ENCOUNTER — Encounter (INDEPENDENT_AMBULATORY_CARE_PROVIDER_SITE_OTHER): Payer: Self-pay | Admitting: Internal Medicine

## 2020-10-24 VITALS — BP 132/92 | HR 60 | Temp 97.7°F | Ht 73.0 in | Wt 205.0 lb

## 2020-10-24 DIAGNOSIS — E785 Hyperlipidemia, unspecified: Secondary | ICD-10-CM | POA: Diagnosis not present

## 2020-10-24 DIAGNOSIS — R972 Elevated prostate specific antigen [PSA]: Secondary | ICD-10-CM | POA: Diagnosis not present

## 2020-10-24 DIAGNOSIS — Z1211 Encounter for screening for malignant neoplasm of colon: Secondary | ICD-10-CM | POA: Diagnosis not present

## 2020-10-24 DIAGNOSIS — E559 Vitamin D deficiency, unspecified: Secondary | ICD-10-CM

## 2020-10-24 DIAGNOSIS — E291 Testicular hypofunction: Secondary | ICD-10-CM | POA: Diagnosis not present

## 2020-10-24 NOTE — Progress Notes (Signed)
Metrics: Intervention Frequency ACO  Documented Smoking Status Yearly  Screened one or more times in 24 months  Cessation Counseling or  Active cessation medication Past 24 months  Past 24 months   Guideline developer: UpToDate (See UpToDate for funding source) Date Released: 2014       Wellness Office Visit  Subjective:  Patient ID: [name] [name], male    DOB: 21-Apr-1956  Age: 65 y.o. MRN: [id number]  CC: This very pleasant man comes in for follow-up of testosterone therapy and dyslipidemia as well as elevated PSA in the past. HPI  He has no complaints and is doing well.  He has vitamin D deficiency and takes vitamin D3 for supplementation. He continues with testosterone therapy twice a week. He did have elevation in PSA but free PSA was acceptable and unlikely to be malignant but we would like to check it again today. He denies any chest pain, dyspnea, palpitations or limb weakness. He had mild dyslipidemia with elevated LDL cholesterol.  He has been fasting today. Past Medical History:  Diagnosis Date  . Bladder outlet obstruction   . BPH (benign prostatic hypertrophy)   . BPH (benign prostatic hypertrophy) with urinary obstruction 06/11/2013  . Frequency of urination   . Nocturia   . Urgency of urination    Past Surgical History:  Procedure Laterality Date  . GREEN LIGHT LASER TURP (TRANSURETHRAL RESECTION OF PROSTATE N/A 06/11/2013   Procedure: GREEN LIGHT LASER TURP (TRANSURETHRAL RESECTION OF PROSTATE;  Surgeon: Malka So, MD;  Location: [facility];  Service: Urology;  Laterality: N/A;  . LUMBAR DISC SURGERY  1992   L4 -- L5     Family History  Family history unknown: Yes    Social History   Social History Narrative  . Not on file   Social History   Tobacco Use  . Smoking status: Never Smoker  . Smokeless tobacco: Never Used  Substance Use Topics  . Alcohol use: Yes    Comment: OCCASIONAL    Current Meds  Medication Sig  .  Cholecalciferol (VITAMIN D-3) 125 MCG (5000 UT) TABS Take 2 tablets by mouth daily at 12 noon.  Marland Kitchen testosterone cypionate (DEPO-TESTOSTERONE) 200 MG/ML injection Inject 0.5 mLs (100 mg total) into the muscle 2 (two) times a week.      Depression screen [facility] 2/9 10/24/2020 04/18/2020  Decreased Interest 0 0  Down, Depressed, Hopeless 0 0  PHQ - 2 Score 0 0  Altered sleeping 0 -  Tired, decreased energy 0 -  Change in appetite 0 -  Feeling bad or failure about yourself  0 -  Trouble concentrating 0 -  Moving slowly or fidgety/restless 0 -  Suicidal thoughts 0 -  PHQ-9 Score 0 -  Difficult doing work/chores Not difficult at all -     Objective:   Today's Vitals: BP (!) 132/92   Pulse 60   Temp 97.7 F (36.5 C) (Temporal)   Ht 6\' 1"  (1.854 m)   Wt 205 lb (93 kg)   SpO2 95%   BMI 27.05 kg/m  Vitals with BMI 10/24/2020 04/18/2020 07/08/2019  Height 6\' 1"  6\' 1"  6\' 1"   Weight 205 lbs 199 lbs 200 lbs  BMI 27.05 25.85 27.78  Systolic 242 353 614  Diastolic 92 70 87  Pulse 60 - 67     Physical Exam    He looks systemically well.  He has gained about 6 pounds since the summer.  He typically does this.  Blood pressure is acceptable.   Assessment   1. Testicular failure   2. Dyslipidemia   3. Elevated PSA   4. Colon cancer screening   5. Vitamin D deficiency disease       Tests ordered Orders Placed This Encounter  Procedures  . PSA, Total with Reflex to PSA, Free  . Cardio IQ Adv Lipid and Inflamm Pnl  . Ambulatory referral to Gastroenterology     Plan: 1. Continue with testosterone therapy as before, he seems to be tolerating this. 2. We will check a cardio IQ lipid panel to further stratify his risk. 3. Repeat PSA with percent free PSA also. 4. Continue vitamin D3 supplementation for vitamin D deficiency. 5. Further recommendations will depend on blood results and I will see him in 6 months time for his annual physical exam.   No orders of the defined types  were placed in this encounter.   Doree Albee, MD

## 2020-10-25 LAB — PSA, TOTAL WITH REFLEX TO PSA, FREE: PSA, Total: 3.8 ng/mL (ref <=4.0)

## 2020-10-28 LAB — CARDIO IQ ADV LIPID AND INFLAMM PNL
Apolipoprotein B: 97 mg/dL — ABNORMAL HIGH (ref <90)
Cholesterol: 195 mg/dL (ref <200)
HDL: 55 mg/dL (ref >39)
LDL Cholesterol (Calc): 122 mg/dL (calc) — ABNORMAL HIGH (ref <100)
LDL Large: 6455 nmol/L — ABNORMAL LOW (ref >6729)
LDL Medium: 302 nmol/L — ABNORMAL HIGH (ref <215)
LDL Particle Number: 1273 nmol/L — ABNORMAL HIGH (ref <1138)
LDL Peak Size: 221.1 Angstrom — ABNORMAL LOW (ref >222.9)
LDL Small: 179 nmol/L — ABNORMAL HIGH (ref <142)
Lipoprotein (a): 133 nmol/L — ABNORMAL HIGH (ref <75)
Non-HDL Cholesterol (Calc): 140 mg/dL (calc) — ABNORMAL HIGH (ref <130)
PLAC: 138 nmol/min/mL — ABNORMAL HIGH (ref <124)
Total CHOL/HDL Ratio: 3.5 calc (ref <3.6)
Triglycerides: 82 mg/dL (ref <150)
hs-CRP: 0.4 mg/L (ref <1.0)

## 2021-01-10 ENCOUNTER — Other Ambulatory Visit (INDEPENDENT_AMBULATORY_CARE_PROVIDER_SITE_OTHER): Payer: Self-pay | Admitting: Internal Medicine

## 2021-01-10 MED ORDER — TESTOSTERONE CYPIONATE 200 MG/ML IM SOLN: 100.0000 mg | INTRAMUSCULAR | 2 refills | Status: DC

## 2021-03-06 ENCOUNTER — Other Ambulatory Visit (INDEPENDENT_AMBULATORY_CARE_PROVIDER_SITE_OTHER): Payer: Self-pay | Admitting: Internal Medicine

## 2021-03-06 DIAGNOSIS — D2261 Melanocytic nevi of right upper limb, including shoulder: Secondary | ICD-10-CM | POA: Diagnosis not present

## 2021-03-06 DIAGNOSIS — D485 Neoplasm of uncertain behavior of skin: Secondary | ICD-10-CM | POA: Diagnosis not present

## 2021-03-06 DIAGNOSIS — D225 Melanocytic nevi of trunk: Secondary | ICD-10-CM | POA: Diagnosis not present

## 2021-03-06 DIAGNOSIS — Z1283 Encounter for screening for malignant neoplasm of skin: Secondary | ICD-10-CM | POA: Diagnosis not present

## 2021-03-06 MED ORDER — TESTOSTERONE CYPIONATE 200 MG/ML IM SOLN: 100.0000 mg | INTRAMUSCULAR | 2 refills | Status: DC

## 2021-03-28 ENCOUNTER — Other Ambulatory Visit (HOSPITAL_COMMUNITY): Payer: Self-pay

## 2021-03-28 MED ORDER — AMOXICILLIN 500 MG PO CAPS
ORAL_CAPSULE | ORAL | 1 refills | Status: DC
  Filled 2021-03-28: qty 21, 7d supply, fill #0

## 2021-03-28 MED ORDER — HYDROCODONE-ACETAMINOPHEN 5-325 MG PO TABS
1.0000 | ORAL_TABLET | Freq: Four times a day (QID) | ORAL | 0 refills | Status: DC | PRN
  Filled 2021-03-28: qty 15, 3d supply, fill #0

## 2021-04-11 DIAGNOSIS — Z0000 Encounter for general adult medical examination without abnormal findings: Secondary | ICD-10-CM | POA: Diagnosis not present

## 2021-04-11 DIAGNOSIS — Z125 Encounter for screening for malignant neoplasm of prostate: Secondary | ICD-10-CM | POA: Diagnosis not present

## 2021-04-19 DIAGNOSIS — R519 Headache, unspecified: Secondary | ICD-10-CM | POA: Diagnosis not present

## 2021-04-19 DIAGNOSIS — Z0000 Encounter for general adult medical examination without abnormal findings: Secondary | ICD-10-CM | POA: Diagnosis not present

## 2021-04-19 DIAGNOSIS — D751 Secondary polycythemia: Secondary | ICD-10-CM | POA: Diagnosis not present

## 2021-04-19 DIAGNOSIS — E782 Mixed hyperlipidemia: Secondary | ICD-10-CM | POA: Diagnosis not present

## 2021-04-24 ENCOUNTER — Encounter (INDEPENDENT_AMBULATORY_CARE_PROVIDER_SITE_OTHER): Payer: 59 | Admitting: Internal Medicine

## 2021-08-31 DIAGNOSIS — E291 Testicular hypofunction: Secondary | ICD-10-CM | POA: Diagnosis not present

## 2021-08-31 DIAGNOSIS — R030 Elevated blood-pressure reading, without diagnosis of hypertension: Secondary | ICD-10-CM | POA: Diagnosis not present

## 2021-08-31 DIAGNOSIS — Z8249 Family history of ischemic heart disease and other diseases of the circulatory system: Secondary | ICD-10-CM | POA: Diagnosis not present

## 2021-08-31 DIAGNOSIS — Z833 Family history of diabetes mellitus: Secondary | ICD-10-CM | POA: Diagnosis not present

## 2021-10-06 DIAGNOSIS — H6123 Impacted cerumen, bilateral: Secondary | ICD-10-CM | POA: Diagnosis not present

## 2021-10-06 DIAGNOSIS — H9313 Tinnitus, bilateral: Secondary | ICD-10-CM | POA: Diagnosis not present

## 2021-10-11 DIAGNOSIS — Z1283 Encounter for screening for malignant neoplasm of skin: Secondary | ICD-10-CM | POA: Diagnosis not present

## 2021-10-11 DIAGNOSIS — D225 Melanocytic nevi of trunk: Secondary | ICD-10-CM | POA: Diagnosis not present

## 2021-10-27 DIAGNOSIS — H903 Sensorineural hearing loss, bilateral: Secondary | ICD-10-CM | POA: Diagnosis not present

## 2021-11-21 DIAGNOSIS — H52229 Regular astigmatism, unspecified eye: Secondary | ICD-10-CM | POA: Diagnosis not present

## 2022-01-24 DIAGNOSIS — Z0000 Encounter for general adult medical examination without abnormal findings: Secondary | ICD-10-CM | POA: Diagnosis not present

## 2022-01-31 DIAGNOSIS — Z23 Encounter for immunization: Secondary | ICD-10-CM | POA: Diagnosis not present

## 2022-01-31 DIAGNOSIS — D751 Secondary polycythemia: Secondary | ICD-10-CM | POA: Diagnosis not present

## 2022-01-31 DIAGNOSIS — E782 Mixed hyperlipidemia: Secondary | ICD-10-CM | POA: Diagnosis not present

## 2022-01-31 DIAGNOSIS — D75839 Thrombocytosis, unspecified: Secondary | ICD-10-CM | POA: Diagnosis not present

## 2022-01-31 DIAGNOSIS — E291 Testicular hypofunction: Secondary | ICD-10-CM | POA: Diagnosis not present

## 2022-01-31 DIAGNOSIS — Z0000 Encounter for general adult medical examination without abnormal findings: Secondary | ICD-10-CM | POA: Diagnosis not present

## 2022-01-31 DIAGNOSIS — N401 Enlarged prostate with lower urinary tract symptoms: Secondary | ICD-10-CM | POA: Diagnosis not present

## 2022-01-31 DIAGNOSIS — Z125 Encounter for screening for malignant neoplasm of prostate: Secondary | ICD-10-CM | POA: Diagnosis not present

## 2022-02-01 ENCOUNTER — Other Ambulatory Visit: Payer: Self-pay | Admitting: Internal Medicine

## 2022-02-01 DIAGNOSIS — E782 Mixed hyperlipidemia: Secondary | ICD-10-CM

## 2022-03-01 ENCOUNTER — Ambulatory Visit: Payer: 59 | Admitting: Urology

## 2022-03-13 ENCOUNTER — Ambulatory Visit
Admission: RE | Admit: 2022-03-13 | Discharge: 2022-03-13 | Disposition: A | Discharge status: Home / Self Care | Payer: No Typology Code available for payment source | Source: Ambulatory Visit | Attending: Internal Medicine | Admitting: Internal Medicine

## 2022-03-13 DIAGNOSIS — E782 Mixed hyperlipidemia: Secondary | ICD-10-CM

## 2022-03-14 DIAGNOSIS — I2510 Atherosclerotic heart disease of native coronary artery without angina pectoris: Secondary | ICD-10-CM | POA: Diagnosis not present

## 2022-03-14 DIAGNOSIS — I7121 Aneurysm of the ascending aorta, without rupture: Secondary | ICD-10-CM | POA: Diagnosis not present

## 2022-03-16 ENCOUNTER — Encounter (HOSPITAL_COMMUNITY): Payer: 59 | Admitting: Hematology

## 2022-03-21 DIAGNOSIS — D751 Secondary polycythemia: Secondary | ICD-10-CM | POA: Diagnosis not present

## 2022-04-16 NOTE — Progress Notes (Unsigned)
[name]       [name]             [phone]     TCTS Consult   PCP is Doree Albee, MD (Inactive) Referring Provider is Deland Pretty, MD  Reason for consult: Evaluation for ascending aortic aneurysm   HPI: [name] is a 66 year old male lifelong non-smoker with past history of BPH and family history of abdominal aortic aneurysm.  He recently underwent a cardiac scoring CT scan of his chest on 03/13/2022 that incidentally identified a 4.5 cm ascending aortic aneurysm.  He was also noted to have a cluster of left lower lobe nodules with the largest measuring 3 mm and a right lower lobe solitary nodule measuring 5 mm.  He was referred for evaluation and surveillance of his ascending aortic aneurysm. 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  He has had some vague epigastric pain comes and goes which is apparently what prompted the calcium scoring CT scan.  His father had an ascending aortic aneurysm which was repaired along with bypass grafting and an aortic valve replacement several years ago when he was 57.  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  Past Medical History:  Diagnosis Date   Bladder outlet obstruction    BPH (benign prostatic hypertrophy)    BPH (benign prostatic hypertrophy) with urinary obstruction 06/11/2013   Frequency of urination    Nocturia    Urgency of urination     Past Surgical History:  Procedure Laterality Date   GREEN LIGHT LASER TURP (TRANSURETHRAL RESECTION OF PROSTATE N/A 06/11/2013   Procedure: GREEN LIGHT LASER TURP (TRANSURETHRAL RESECTION OF PROSTATE;  Surgeon: Malka So, MD;  Location: [facility];  Service: Urology;  Laterality: N/A;   LUMBAR DISC SURGERY  1992   L4 -- L5    Family History  Family history unknown: Yes    Social History Social History   Tobacco Use   Smoking status: Never   Smokeless tobacco: Never  Vaping Use   Vaping  Use: Never used  Substance Use Topics   Alcohol use: Yes    Comment: OCCASIONAL   Drug use: No    Current Outpatient Medications  Medication Sig Dispense Refill   Cholecalciferol (VITAMIN D-3) 125 MCG (5000 UT) TABS Take 2 tablets by mouth daily at 12 noon.     rosuvastatin (CRESTOR) 10 MG tablet 1 tablet     testosterone cypionate (DEPO-TESTOSTERONE) 200 MG/ML injection Inject 0.5 mLs (100 mg total) into the muscle 2 (two) times a week. (Patient not taking: Reported on 04/17/2022) 10 mL 2   No current facility-administered medications for this visit.    No Known Allergies  Review of Systems: Review of Systems  Constitutional: Negative.   HENT: Negative.    Eyes: Negative.   Respiratory: Negative.    Cardiovascular: Negative.   Gastrointestinal: Negative.   Genitourinary: Negative.   Musculoskeletal:  Positive for back pain.  Skin: Negative.   Neurological: Negative.   Endo/Heme/Allergies: Negative.   Psychiatric/Behavioral: Negative.       BP 130/82 (BP Location: Left Arm, Patient Position: Sitting)   Pulse 65   Resp 18   Ht '6\' 1"'$  (1.854 m)   Wt 192 lb (87.1 kg)   SpO2 95% Comment: RA  BMI 25.33 kg/m  Physical Exam:  Vital signs BP 130/82 Pulse 65 Respirations 18 SPO2 95% on  room air  General: Well-developed 66 year old male in no distress. HEENT: Unremarkable Neck: Supple, no carotid bruit or JVD Chest: Symmetrical, lungs clear to auscultation Heart: Regular rate and rhythm.  Soft grade 2/6 systolic murmur. Abdomen: Soft, nontender Extremities: No deformities, all well perfused with palpable distal pulses.  No peripheral edema. Neuro: Grossly nonfocal  Diagnostic Tests: CLINICAL DATA:  Mixed hyperlipidemia   EXAM: CT CARDIAC CORONARY ARTERY CALCIUM SCORE   TECHNIQUE: Non-contrast imaging through the heart was performed using prospective ECG gating. Image post processing was performed on an independent workstation, allowing for quantitative  analysis of the heart and coronary arteries. Note that this exam targets the heart and the chest was not imaged in its entirety.   COMPARISON:  None Available.   FINDINGS: CORONARY CALCIUM SCORES:   Left Main: 0   LAD: 150   LCx: 2   RCA: 1   Total Agatston Score: 153   MESA database percentile: 63   AORTA MEASUREMENTS:   Ascending Aorta: 45 mm   Descending Aorta: 29 mm   OTHER FINDINGS:   Heart is normal size. Aneurysmal dilatation of the ascending thoracic aorta, 4.5 cm. No adenopathy. Clustered nodules peripherally in the left lower lobe measuring up to 3 mm. Posterior right lower lobe nodule measures 5 mm. No effusions. No acute findings in the upper abdomen. Chest wall soft tissues are unremarkable. No acute bony abnormality.   IMPRESSION: Total Agatston score: [place] percentile: 63   4.5 cm ascending thoracic aortic aneurysm. Recommend semi-annual imaging followup by CTA or MRA and referral to cardiothoracic surgery if not already obtained. This recommendation follows 2010 ACCF/AHA/AATS/ACR/ASA/SCA/SCAI/SIR/STS/SVM Guidelines for the Diagnosis and Management of Patients With Thoracic Aortic Disease. Circulation. 2010; 121: J287-O676. Aortic aneurysm NOS (ICD10-I71.9)   Small bilateral lower lobe pulmonary nodules measuring up to 5 mm. No follow-up needed if patient is low-risk (and has no known or suspected primary neoplasm). Non-contrast chest CT can be considered in 12 months if patient is high-risk. This recommendation follows the consensus statement: Guidelines for Management of Incidental Pulmonary Nodules Detected on CT Images: From the Fleischner Society 2017; Radiology 2017; 284:228-243.     Electronically Signed   By: Rolm Baptise M.D.   On: 03/13/2022 20:31    Impression / Plan:  -Ascending aortic aneurysm: Patient is asymptomatic and this is an incidental finding on calcium scoring CT scan.  We discussed the importance of  regular surveillance, blood pressure management, avoidance of strenuous activity, and the avoidance of oral quinolones.  We will plan to reimage with a noncontrast CT chest in 6 months.  We will also obtain an echocardiogram to evaluate the aortic valve prior to that follow-up visit.  -Bilateral lower lobe nodules: Maximum diameter is 3 mm in the clustered nodules in the left lower lobe and maximal diameter is 5 mm for the solitary nodule on the right.  These will be reevaluated as well on the next CT in 6 months.   Antony Odea, PA-C [facility] [phone]

## 2022-04-17 ENCOUNTER — Institutional Professional Consult (permissible substitution): Payer: Medicare HMO | Admitting: Physician Assistant

## 2022-04-17 VITALS — BP 130/82 | HR 65 | Resp 18 | Ht 73.0 in | Wt 192.0 lb

## 2022-04-17 DIAGNOSIS — I7121 Aneurysm of the ascending aorta, without rupture: Secondary | ICD-10-CM | POA: Diagnosis not present

## 2022-04-17 DIAGNOSIS — I712 Thoracic aortic aneurysm, without rupture, unspecified: Secondary | ICD-10-CM

## 2022-04-17 DIAGNOSIS — I2510 Atherosclerotic heart disease of native coronary artery without angina pectoris: Secondary | ICD-10-CM | POA: Diagnosis not present

## 2022-04-17 DIAGNOSIS — R0789 Other chest pain: Secondary | ICD-10-CM | POA: Diagnosis not present

## 2022-04-17 DIAGNOSIS — I10 Essential (primary) hypertension: Secondary | ICD-10-CM | POA: Diagnosis not present

## 2022-04-17 NOTE — Patient Instructions (Signed)
Recommend you avoid lifting greater than 50 pounds  Continue to closely manage her blood pressure to keep it less than 130/80.  Continue Crestor  Follow-up in 6 months with noncontrast CT chest to reevaluate the thoracic aortic aneurysm and pulmonary nodules.  We will also obtain an echocardiogram prior to that visit.

## 2022-05-02 DIAGNOSIS — I2510 Atherosclerotic heart disease of native coronary artery without angina pectoris: Secondary | ICD-10-CM | POA: Diagnosis not present

## 2022-05-29 DIAGNOSIS — I10 Essential (primary) hypertension: Secondary | ICD-10-CM | POA: Diagnosis not present

## 2022-05-29 DIAGNOSIS — I2510 Atherosclerotic heart disease of native coronary artery without angina pectoris: Secondary | ICD-10-CM | POA: Diagnosis not present

## 2022-05-29 DIAGNOSIS — R972 Elevated prostate specific antigen [PSA]: Secondary | ICD-10-CM | POA: Diagnosis not present

## 2022-06-19 DIAGNOSIS — H52223 Regular astigmatism, bilateral: Secondary | ICD-10-CM | POA: Diagnosis not present

## 2022-06-19 DIAGNOSIS — H524 Presbyopia: Secondary | ICD-10-CM | POA: Diagnosis not present

## 2022-06-27 DIAGNOSIS — I2510 Atherosclerotic heart disease of native coronary artery without angina pectoris: Secondary | ICD-10-CM | POA: Diagnosis not present

## 2022-06-27 DIAGNOSIS — R972 Elevated prostate specific antigen [PSA]: Secondary | ICD-10-CM | POA: Diagnosis not present

## 2022-09-07 ENCOUNTER — Other Ambulatory Visit: Payer: Self-pay | Admitting: Thoracic Surgery (Cardiothoracic Vascular Surgery)

## 2022-09-07 DIAGNOSIS — I712 Thoracic aortic aneurysm, without rupture, unspecified: Secondary | ICD-10-CM

## 2022-10-17 ENCOUNTER — Ambulatory Visit (HOSPITAL_COMMUNITY): Payer: Medicare Other | Attending: Thoracic Surgery (Cardiothoracic Vascular Surgery)

## 2022-10-17 DIAGNOSIS — I712 Thoracic aortic aneurysm, without rupture, unspecified: Secondary | ICD-10-CM | POA: Diagnosis present

## 2022-10-17 LAB — ECHOCARDIOGRAM COMPLETE
Area-P 1/2: 2.52 cm2
P 1/2 time: 740 msec
S' Lateral: 3.5 cm

## 2022-10-23 ENCOUNTER — Encounter: Payer: Self-pay | Admitting: Physician Assistant

## 2022-10-23 ENCOUNTER — Ambulatory Visit (INDEPENDENT_AMBULATORY_CARE_PROVIDER_SITE_OTHER): Payer: Medicare Other | Admitting: Physician Assistant

## 2022-10-23 ENCOUNTER — Ambulatory Visit
Admission: RE | Admit: 2022-10-23 | Discharge: 2022-10-23 | Disposition: A | Discharge status: Home / Self Care | Payer: Medicare Other | Source: Ambulatory Visit | Attending: Thoracic Surgery (Cardiothoracic Vascular Surgery) | Admitting: Thoracic Surgery (Cardiothoracic Vascular Surgery)

## 2022-10-23 VITALS — BP 146/78 | HR 67 | Resp 20 | Ht 73.0 in | Wt 204.0 lb

## 2022-10-23 DIAGNOSIS — I712 Thoracic aortic aneurysm, without rupture, unspecified: Secondary | ICD-10-CM

## 2022-10-23 MED ORDER — IOPAMIDOL (ISOVUE-370) INJECTION 76%
75.0000 mL | Freq: Once | INTRAVENOUS | Status: AC | PRN
  Administered 2022-10-23: 75 mL via INTRAVENOUS

## 2022-10-23 NOTE — Patient Instructions (Addendum)
Risk Modification in those with ascending thoracic aortic aneurysm:  Continue good control of blood pressure (prefer SBP 130/80 or less)-continue Losartan  2. Avoid fluoroquinolone antibiotics (I.e Ciprofloxacin, Avelox, Levofloxacin, Ofloxacin)  3.  Use of statin (to decrease cardiovascular risk)-continue Crestor  4.  Exercise and activity limitations is individualized, but in general, contact sports are to be  avoided and one should avoid heavy lifting (defined as half of ideal body weight) and exercises involving sustained Valsalva maneuver.  5. Counseling for those suspected of having genetically mediated disease. First-degree relatives of those with TAA disease should be screened as well as those who have a connective tissue disease (I.e with Marfan syndrome, Ehlers-Danlos syndrome,  and Loeys-Dietz syndrome) or a  bicuspid aortic valve,have an increased risk for  complications related to TAA. The aforementioned does not apply to this patient  6. No history of tobacco use/abuse.

## 2022-10-23 NOTE — Progress Notes (Signed)
[address]       [place] [address]             [phone]   PCP is Doree Albee, MD (Inactive) Referring Provider is Deland Pretty, MD  Chief Complaint: Ascending thoracic aortic aneurysm   HPI: This is a 67 year old male with a past medical history of BPH (s/p GreenLight laser prostatectomy) and family history of abdominal aortic aneurysm who has been followed Since 2023 for an ascending thoracic aortic aneurysm and small bilateral pulmonary nodules. He was seen on 04/17/2022 and his ATAA at that time was 4.5 cm and small bilateral pulmonary nodules (up to 5 mm). Patient denies chest or back pain, LE edema, or shortness of breath.   Past Medical History:  Diagnosis Date   Bladder outlet obstruction    BPH (benign prostatic hypertrophy)    BPH (benign prostatic hypertrophy) with urinary obstruction 06/11/2013   Frequency of urination    Nocturia    Urgency of urination     Past Surgical History:  Procedure Laterality Date   GREEN LIGHT LASER TURP (TRANSURETHRAL RESECTION OF PROSTATE N/A 06/11/2013   Procedure: GREEN LIGHT LASER TURP (TRANSURETHRAL RESECTION OF PROSTATE;  Surgeon: Malka So, MD;  Location[facility];  Service: Urology;  Laterality: N/A;   LUMBAR DISC SURGERY  1992   L4 -- L5    Family History  Family history unknown: Yes  ATAA  Social History Social History   Tobacco Use   Smoking status: Never   Smokeless tobacco: Never  Vaping Use   Vaping Use: Never used  Substance Use Topics   Alcohol use: Yes    Comment: OCCASIONAL   Drug use: No    Current Outpatient Medications  Medication Sig Dispense Refill   Cholecalciferol (VITAMIN D-3) 125 MCG (5000 UT) TABS Take 2 tablets by mouth daily at 12 noon.     rosuvastatin (CRESTOR) 10 MG tablet 1 tablet     testosterone cypionate (DEPO-TESTOSTERONE) 200 MG/ML injection Inject 0.5 mLs (100 mg total) into the muscle 2 (two) times a week. (Patient not taking:  Reported on 04/17/2022) 10 mL 2  Allergies: No Known Allergies    Review of Systems  Chest Pain [ N ] Resting SOB Aqua.Slicker ] Exertional SOB [ N ]  Pedal Edema Aqua.Slicker  ] Syncope [ N ] Presyncope [ N ]  General Review of Systems: [Y] = yes [ N]=no  Consitutional:   nausea [ N];  fever Aqua.Slicker ];  Eye : Amaurosis fugax[ N ];  Resp: cough Aqua.Slicker ];  hemoptysis[N ];  GI: vomiting[ N]; melena[ N]; hematochezia [N];  LK:GMWNUUVOZ[D ]; Heme/Lymph: anemia[ N];  Neuro: TIA[ N];stroke[ N];  seizures[ N];  Endocrine: diabetes[ N];   Vital Signs: Vitals:   10/23/22 1358  BP: (!) 146/78  Pulse: 67  Resp: 20  SpO2: 95%      Physical Exam: CV-RRR, no murmur Pulmonary-Clear to auscultation bilaterally Neck-Soft, no carotid bruit Abdomen-Soft, non tender, bowel sounds present Extremities-No LE edema Neurologic-Grossly intact without focal deficit   Diagnostic Tests:  Narrative & Impression  CLINICAL DATA:  Aortic aneurysm suspected.   Creatinine was obtained on site at [facility] at [address]   Results: Creatinine 0.9 mg/dL.   EXAM: CT ANGIOGRAPHY CHEST WITH CONTRAST   TECHNIQUE: Multidetector CT imaging of the chest was performed using the standard protocol during bolus administration of intravenous contrast. Multiplanar CT image reconstructions  and MIPs were obtained to evaluate the vascular anatomy.   RADIATION DOSE REDUCTION: This exam was performed according to the departmental dose-optimization program which includes automated exposure control, adjustment of the mA and/or kV according to patient size and/or use of iterative reconstruction technique.   CONTRAST:  69m ISOVUE-370 IOPAMIDOL (ISOVUE-370) INJECTION 76%   COMPARISON:  Cardiac CT 03/13/2022   FINDINGS: Cardiovascular: No acute vascular findings are identified. The ascending aorta is mildly dilated to 4.2 cm. No evidence of focal aneurysm or dissection. No evidence of acute pulmonary embolism. Mild  left-sided coronary artery atherosclerosis noted. The heart size is normal. There is no pericardial effusion.   Mediastinum/Nodes: There are no enlarged mediastinal, hilar or axillary lymph nodes.Small hiatal hernia. The thyroid gland and trachea appear unremarkable.   Lungs/Pleura: No pleural effusion or pneumothorax. Mild centrilobular emphysema. Scattered small nodules are again noted at both lung bases, unchanged from the previous cardiac CT. The largest solid nodule is in the lingula, measuring 7 mm on image 83/6. Some of the nodules are subpleural in location. No new or enlarging nodules are identified.   Upper abdomen: No significant findings are seen in the visualized upper abdomen.   Musculoskeletal/Chest wall: There is no chest wall mass or suspicious osseous finding. Mild spondylosis. Stable fatty intramuscular lesion along the inferior aspect of the right scapula, measuring up to 3.9 cm on image 121/9.   Review of the MIP images confirms the above findings.   IMPRESSION: 1. Stable mild dilatation of the ascending aorta to 4.2 cm. No evidence of focal aneurysm or dissection. No evidence of acute pulmonary embolism or other acute vascular findings. 2. Stable small pulmonary nodules at both lung bases, likely benign based on stability. No new or enlarging nodules identified. Consider follow-up CT in 12-18 months (optional for low-risk patients, but recommended for high-risk patients). This recommendation follows the consensus statement: Guidelines for Management of Incidental Pulmonary Nodules Detected on CT Images: From the Fleischner Society 2017; Radiology 2017; 284:228-243. 3. Small hiatal hernia. 4. Stable fatty intramuscular lesion along the inferior aspect of the right scapula, likely a lipoma. 5. Coronary artery atherosclerosis and emphysema (ICD10-J43.9).     Electronically Signed   By: WRichardean SaleM.D.   On: 10/23/2022 13:29    Impression and  Plan: We discussed the findings of the CTA and that he does not need surgery for his ATAA at this time. Of note, he had an echo 10/17/2021 that showed LVEF 55-60%, AV normal in structure, mild AI and no AS, mil DR, and aortic dilatation 46 mm.  His BP is elevated at this office visit. He was instructed on the importance of good blood pressure control, low salt/heart healthy diet. If BP consistently 140's or higher, will need adjustment to medication.  In addition, bilateral pulmonary nodules are stable. He will return for further surveillance in 6 months.      DNani Skillern PA-C [facility] [phone]

## 2022-10-26 ENCOUNTER — Encounter: Payer: Self-pay | Admitting: Emergency Medicine

## 2022-10-26 ENCOUNTER — Ambulatory Visit
Admission: EM | Admit: 2022-10-26 | Discharge: 2022-10-26 | Disposition: A | Discharge status: Home / Self Care | Payer: Medicare Other | Attending: Family Medicine | Admitting: Family Medicine

## 2022-10-26 ENCOUNTER — Ambulatory Visit (INDEPENDENT_AMBULATORY_CARE_PROVIDER_SITE_OTHER): Payer: Medicare Other

## 2022-10-26 DIAGNOSIS — S67193A Crushing injury of left middle finger, initial encounter: Secondary | ICD-10-CM

## 2022-10-26 DIAGNOSIS — S61213A Laceration without foreign body of left middle finger without damage to nail, initial encounter: Secondary | ICD-10-CM | POA: Diagnosis not present

## 2022-10-26 DIAGNOSIS — S6710XA Crushing injury of unspecified finger(s), initial encounter: Secondary | ICD-10-CM

## 2022-10-26 HISTORY — DX: Pure hypercholesterolemia, unspecified: E78.00

## 2022-10-26 HISTORY — DX: Aortic aneurysm of unspecified site, without rupture: I71.9

## 2022-10-26 MED ORDER — MUPIROCIN 2 % EX OINT
1.0000 | TOPICAL_OINTMENT | Freq: Two times a day (BID) | CUTANEOUS | 0 refills | Status: DC
Start: 2022-10-26 — End: 2023-03-13

## 2022-10-26 MED ORDER — CEPHALEXIN 500 MG PO CAPS: 500.0000 mg | ORAL_CAPSULE | Freq: Two times a day (BID) | ORAL | 0 refills | Status: DC

## 2022-10-26 MED ORDER — CHLORHEXIDINE GLUCONATE 4 % EX LIQD
Freq: Every day | CUTANEOUS | 0 refills | Status: DC | PRN
Start: 2022-10-26 — End: 2023-03-13

## 2022-10-26 MED ORDER — KETOROLAC TROMETHAMINE 30 MG/ML IJ SOLN
30.0000 mg | Freq: Once | INTRAMUSCULAR | Status: AC
  Administered 2022-10-26: 30 mg via INTRAMUSCULAR

## 2022-10-26 MED ORDER — TRAMADOL HCL 50 MG PO TABS: 50.0000 mg | ORAL_TABLET | Freq: Two times a day (BID) | ORAL | 0 refills | Status: DC | PRN

## 2022-10-26 NOTE — Discharge Instructions (Signed)
Clean the area at least once a day with Hibiclens and apply mupirocin ointment and a nonstick pressure dressing.  You may take ibuprofen and Tylenol for pain and a small amount of tramadol has been sent for severe pain episodes and bedtime.  Follow-up if your symptoms are worsening or unresolving

## 2022-10-26 NOTE — ED Provider Notes (Addendum)
[facility]    CSN: [id number] Arrival date & time: 10/26/22  1150      History   Chief Complaint No chief complaint on file.   HPI [name] [name] is a 67 y.o. male.   Patient presenting today with a crush injury and laceration to the left middle finger that occurred today when a piece of wood dropped on his finger.  He denies decreased range of motion, retained foreign body concern, numbness, tingling.  Has been able to control the bleeding fairly well with pressure.  So far has cleaned with soap and water.  Tetanus up-to-date per patient within the last 5 years.      Past Medical History:  Diagnosis Date   Aortic aneurysm [facility])    Bladder outlet obstruction    BPH (benign prostatic hypertrophy)    BPH (benign prostatic hypertrophy) with urinary obstruction 06/11/2013   Frequency of urination    High cholesterol    Nocturia    Urgency of urination     Patient Active Problem List   Diagnosis Date Noted   BPH (benign prostatic hypertrophy) with urinary obstruction 06/11/2013    Past Surgical History:  Procedure Laterality Date   GREEN LIGHT LASER TURP (TRANSURETHRAL RESECTION OF PROSTATE N/A 06/11/2013   Procedure: GREEN LIGHT LASER TURP (TRANSURETHRAL RESECTION OF PROSTATE;  Surgeon: Malka So, MD;  Location[facility];  Service: Urology;  Laterality: N/A;   LUMBAR DISC SURGERY  1992   L4 -- L5       Home Medications    Prior to Admission medications   Medication Sig Start Date End Date Taking? Authorizing Provider  cephALEXin (KEFLEX) 500 MG capsule Take 1 capsule (500 mg total) by mouth 2 (two) times daily. 10/26/22  Yes Volney American, PA-C  chlorhexidine (HIBICLENS) 4 % external liquid Apply topically daily as needed. 10/26/22  Yes Volney American, PA-C  mupirocin ointment (BACTROBAN) 2 % Apply 1 Application topically 2 (two) times daily. 10/26/22  Yes Volney American, PA-C  traMADol (ULTRAM) 50 MG tablet  Take 1 tablet (50 mg total) by mouth every 12 (twelve) hours as needed. 10/26/22  Yes Volney American, PA-C  Cholecalciferol (VITAMIN D-3) 125 MCG (5000 UT) TABS Take 2 tablets by mouth daily at 12 noon.    [provider]  losartan (COZAAR) 100 MG tablet Take 100 mg by mouth daily.    [provider]  rosuvastatin (CRESTOR) 10 MG tablet 1 tablet 03/14/22   [provider]    Family History Family History  Family history unknown: Yes    Social History Social History   Tobacco Use   Smoking status: Never   Smokeless tobacco: Never  Vaping Use   Vaping Use: Never used  Substance Use Topics   Alcohol use: Yes    Comment: OCCASIONAL   Drug use: No     Allergies   Patient has no known allergies.   Review of Systems Review of Systems Per HPI  Physical Exam Triage Vital Signs ED Triage Vitals  Enc Vitals Group     BP 10/26/22 1158 117/71     Pulse Rate 10/26/22 1158 60     Resp 10/26/22 1158 18     Temp 10/26/22 1158 98.6 F (37 C)     Temp Source 10/26/22 1158 Oral     SpO2 10/26/22 1158 95 %     Weight --      Height --  Head Circumference --      Peak Flow --      Pain Score 10/26/22 1159 5     Pain Loc --      Pain Edu? --      Excl. in [place]? --    No data found.  Updated Vital Signs BP 117/71 (BP Location: Right Arm)   Pulse 60   Temp 98.6 F (37 C) (Oral)   Resp 18   SpO2 95%   Visual Acuity Right Eye Distance:   Left Eye Distance:   Bilateral Distance:    Right Eye Near:   Left Eye Near:    Bilateral Near:     Physical Exam Vitals and nursing note reviewed.  Constitutional:      Appearance: Normal appearance.  HENT:     Head: Atraumatic.  Eyes:     Extraocular Movements: Extraocular movements intact.     Conjunctiva/sclera: Conjunctivae normal.  Cardiovascular:     Rate and Rhythm: Normal rate and regular rhythm.  Pulmonary:     Effort: Pulmonary effort is normal.     Breath sounds: Normal breath  sounds.  Musculoskeletal:        General: Swelling, tenderness and signs of injury present. Normal range of motion.     Cervical back: Normal range of motion and neck supple.     Comments: Distal left middle finger with jagged laceration from crush injury, tender to palpation  Skin:    General: Skin is warm.     Comments: Irregularly shaped laceration to the distal tip of the left middle finger with exposed subcutaneous tissue.  The wound edges are quite jagged and not able to be approximated.  Bleeding well-controlled.  No foreign bodies visualized and no damage to the nail  Neurological:     General: No focal deficit present.     Mental Status: He is oriented to person, place, and time.     Motor: No weakness.     Gait: Gait normal.     Comments: Obtained neurovascularly intact  Psychiatric:        Mood and Affect: Mood normal.        Thought Content: Thought content normal.        Judgment: Judgment normal.      UC Treatments / Results  Labs (all labs ordered are listed, but only abnormal results are displayed) Labs Reviewed - No data to display  EKG   Radiology No results found.  Procedures Procedures (including critical care time)  Medications Ordered in UC Medications  ketorolac (TORADOL) 30 MG/ML injection 30 mg (30 mg Intramuscular Given 10/26/22 1256)    Initial Impression / Assessment and Plan / UC Course  I have reviewed the triage vital signs and the nursing notes.  Pertinent labs & imaging results that were available during my care of the patient were reviewed by me and considered in my medical decision making (see chart for details).     Area soaked in Hibiclens, dressed with mupirocin ointment and a nonstick pressure dressing.  Unable to close the wound at this time due to irregular borders and inability to approximate.  Will need to close by secondary intention.  X-ray today of the left middle finger without evidence of acute bony abnormality.  Treat  with Keflex, Bactroban, Hibiclens and small supply of tramadol for severe pain in addition to over-the-counter pain relievers.  IM Toradol given for pain in the immediate.  Return precautions reviewed.  Final Clinical Impressions(s) /  UC Diagnoses   Final diagnoses:  Laceration of left middle finger without foreign body without damage to nail, initial encounter  Crushing injury of left middle finger, initial encounter     Discharge Instructions      Clean the area at least once a day with Hibiclens and apply mupirocin ointment and a nonstick pressure dressing.  You may take ibuprofen and Tylenol for pain and a small amount of tramadol has been sent for severe pain episodes and bedtime.  Follow-up if your symptoms are worsening or unresolving     ED Prescriptions     Medication Sig Dispense Auth. Provider   traMADol (ULTRAM) 50 MG tablet Take 1 tablet (50 mg total) by mouth every 12 (twelve) hours as needed. 10 tablet Volney American, PA-C   cephALEXin (KEFLEX) 500 MG capsule Take 1 capsule (500 mg total) by mouth 2 (two) times daily. 14 capsule Volney American, [place]   mupirocin ointment (BACTROBAN) 2 % Apply 1 Application topically 2 (two) times daily. 22 g Volney American, [place]   chlorhexidine (HIBICLENS) 4 % external liquid Apply topically daily as needed. 120 mL Volney American, [place]      I have reviewed the PDMP during this encounter.   Volney American, PA-C 10/26/22 [address] [place], [place] 11/05/22 [phone]

## 2022-10-26 NOTE — ED Notes (Signed)
Pt's left middle finger was bandaged with a non- adherent pad and coban wrap. Pt understand how and when to change it.

## 2022-10-26 NOTE — ED Triage Notes (Signed)
Dropped a piece of wood on left middle finger today.  Laceration to tip end of finger.  Tetanus up to date.

## 2023-01-31 ENCOUNTER — Other Ambulatory Visit: Payer: Self-pay

## 2023-01-31 LAB — LAB REPORT - SCANNED: EGFR: 76

## 2023-02-27 ENCOUNTER — Encounter: Payer: Self-pay | Admitting: Gastroenterology

## 2023-02-28 ENCOUNTER — Encounter: Payer: Self-pay | Admitting: Internal Medicine

## 2023-03-07 ENCOUNTER — Other Ambulatory Visit: Payer: Self-pay | Admitting: Thoracic Surgery (Cardiothoracic Vascular Surgery)

## 2023-03-07 DIAGNOSIS — I712 Thoracic aortic aneurysm, without rupture, unspecified: Secondary | ICD-10-CM

## 2023-03-13 ENCOUNTER — Telehealth: Payer: Self-pay | Admitting: Gastroenterology

## 2023-03-13 ENCOUNTER — Ambulatory Visit: Payer: Medicare Other | Admitting: *Deleted

## 2023-03-13 VITALS — Ht 73.0 in | Wt 200.0 lb

## 2023-03-13 DIAGNOSIS — Z86010 Personal history of colonic polyps: Secondary | ICD-10-CM

## 2023-03-13 NOTE — Telephone Encounter (Signed)
Patient is rescheduled for procedure. Please provide updated prep

## 2023-03-13 NOTE — Telephone Encounter (Signed)
New instructions with correct date made and sent via my chart and by mail. Pt and RN discussed this during pre-visit and he is aware that RN would do this once the new procedure date was made.

## 2023-03-13 NOTE — Progress Notes (Signed)
Pt's name and DOB verified at the beginning of the pre-visit.  Pt denies any difficulty with ambulating,sitting, laying down or rolling side to side Gave both [facility] main # and MD on call # prior to instructions.  No egg or soy allergy known to patient  No issues known to pt with past sedation with any surgeries or procedures Pt denies having issues being intubated Pt has no issues moving head neck or swallowing No FH of Malignant Hyperthermia Pt is not on diet pills Pt is not on home 02  Pt is not on blood thinners  Pt denies issues with constipation  Pt is not on dialysis Pt denise any abnormal heart rhythms  Pt denies any upcoming cardiac testing Pt encouraged to use to use Singlecare or Goodrx to reduce cost  Patient's chart reviewed by Cathlyn Parsons CNRA prior to pre-visit and patient appropriate for the [facility].  Pre-visit completed and red dot placed by patient's name on their procedure day (on provider's schedule).  . Visit by phone Pt states weight is 200 lb Instructed pt why it is important to and  to call if they have any changes in health or new medications. Directed them to the # given and on instructions.   Pt states they will.  Instructions reviewed with pt and pt states understanding. Instructed to review again prior to procedure. Pt states they will.  Instructions sent by mail with coupon and by my chart Pt needed to change date of procedure and RN to fix instructions and send via my chart and mail once change has been made

## 2023-03-26 ENCOUNTER — Encounter: Payer: Medicare Other | Admitting: Gastroenterology

## 2023-04-05 ENCOUNTER — Encounter: Payer: Medicare Other | Admitting: Gastroenterology

## 2023-04-10 ENCOUNTER — Encounter: Payer: Medicare Other | Admitting: Gastroenterology

## 2023-04-19 ENCOUNTER — Telehealth: Payer: Self-pay | Admitting: Gastroenterology

## 2023-04-19 DIAGNOSIS — Z86010 Personal history of colonic polyps: Secondary | ICD-10-CM

## 2023-04-19 MED ORDER — PEG 3350-KCL-NA BICARB-NACL 420 G PO SOLR: 4000.0000 mL | Freq: Once | ORAL | 0 refills | Status: AC

## 2023-04-19 NOTE — Telephone Encounter (Signed)
Called in [place] to [facility] on [address] with patient to let them know that new prescription was sent.

## 2023-04-19 NOTE — Telephone Encounter (Signed)
Inbound call from patient stating his pharmacy does not have his prescription for his   prep medication. [facility]ing [facility] [place] on [address]. Please advise.

## 2023-04-24 ENCOUNTER — Encounter: Payer: Self-pay | Admitting: Gastroenterology

## 2023-04-24 ENCOUNTER — Ambulatory Visit (AMBULATORY_SURGERY_CENTER): Payer: Medicare Other | Admitting: Gastroenterology

## 2023-04-24 VITALS — BP 125/65 | HR 49 | Temp 98.7°F | Resp 13 | Ht 73.0 in | Wt 200.0 lb

## 2023-04-24 DIAGNOSIS — D127 Benign neoplasm of rectosigmoid junction: Secondary | ICD-10-CM

## 2023-04-24 DIAGNOSIS — Z09 Encounter for follow-up examination after completed treatment for conditions other than malignant neoplasm: Secondary | ICD-10-CM | POA: Diagnosis not present

## 2023-04-24 DIAGNOSIS — Z86010 Personal history of colonic polyps: Secondary | ICD-10-CM

## 2023-04-24 DIAGNOSIS — K641 Second degree hemorrhoids: Secondary | ICD-10-CM

## 2023-04-24 DIAGNOSIS — D124 Benign neoplasm of descending colon: Secondary | ICD-10-CM

## 2023-04-24 DIAGNOSIS — Z1211 Encounter for screening for malignant neoplasm of colon: Secondary | ICD-10-CM

## 2023-04-24 MED ORDER — SODIUM CHLORIDE 0.9 % IV SOLN: 500.0000 mL | Freq: Once | INTRAVENOUS | Status: DC

## 2023-04-24 NOTE — Progress Notes (Signed)
Vss nad trans to pacu 

## 2023-04-24 NOTE — Op Note (Signed)
[facility] Patient Name: [name] [name] Procedure Date: 04/24/2023 11:27 AM MRN: [id number] Endoscopist: Doristine Locks , MD, [id number] Age: 68 Referring MD:  Date of Birth: 05-08-1956 Gender: Male Account #: [id number] Procedure:                Colonoscopy Indications:              Screening for colorectal malignant neoplasm. Last                            colonoscopy was more than 10 years ago. Otherwise,                            no recent GI symptoms. Medicines:                Monitored Anesthesia Care Procedure:                Pre-Anesthesia Assessment:                           - Prior to the procedure, a History and Physical                            was performed, and patient medications and                            allergies were reviewed. The patient's tolerance of                            previous anesthesia was also reviewed. The risks                            and benefits of the procedure and the sedation                            options and risks were discussed with the patient.                            All questions were answered, and informed consent                            was obtained. Prior Anticoagulants: The patient has                            taken no anticoagulant or antiplatelet agents. ASA                            Grade Assessment: II - A patient with mild systemic                            disease. After reviewing the risks and benefits,                            the patient was deemed in satisfactory condition to  undergo the procedure.                           After obtaining informed consent, the colonoscope                            was passed under direct vision. Throughout the                            procedure, the patient's blood pressure, pulse, and                            oxygen saturations were monitored continuously. The                            CF [id number] #[id number] was  introduced through the anus                            and advanced to the the cecum, identified by the                            appendiceal orifice, IC valve and                            transillumination. The colonoscopy was performed                            without difficulty. The patient tolerated the                            procedure well. The quality of the bowel                            preparation was excellent. The ileocecal valve,                            appendiceal orifice, and rectum were photographed. Scope In: 11:35:38 AM Scope Out: 11:52:10 AM Scope Withdrawal Time: 0 hours 11 minutes 12 seconds  Total Procedure Duration: 0 hours 16 minutes 32 seconds  Findings:                 The perianal and digital rectal examinations were                            normal.                           Two sessile polyps were found in the descending                            colon. The polyps were 2 to 4 mm in size. These                            polyps were removed with a cold snare. Resection  and retrieval were complete. Estimated blood loss                            was minimal.                           A 2 mm polyp was found in the recto-sigmoid colon.                            The polyp was sessile. The polyp was removed with a                            cold snare. Resection and retrieval were complete.                            Estimated blood loss was minimal.                           Non-bleeding internal hemorrhoids were found during                            retroflexion. The hemorrhoids were small. Complications:            No immediate complications. Estimated Blood Loss:     Estimated blood loss was minimal. Impression:               - Two 2 to 4 mm polyps in the descending colon,                            removed with a cold snare. Resected and retrieved.                           - One 2 mm polyp at the recto-sigmoid  colon,                            removed with a cold snare. Resected and retrieved.                           - Non-bleeding internal hemorrhoids. Recommendation:           - Patient has a contact number available for                            emergencies. The signs and symptoms of potential                            delayed complications were discussed with the                            patient. Return to normal activities tomorrow.                            Written discharge instructions were provided to the  patient.                           - Resume previous diet.                           - Continue present medications.                           - Await pathology results.                           - Repeat colonoscopy for surveillance based on                            pathology results.                           - Return to GI clinic PRN.                           - Use fiber, for example Citrucel, Fibercon, Konsyl                            or Metamucil. Doristine Locks, MD 04/24/2023 11:58:24 AM

## 2023-04-24 NOTE — Patient Instructions (Signed)
Handouts on polyps and hemorrhoids provided to you today  Recommend a fiber supplement Repeat colonoscopy recommended based on pathology results    YOU HAD AN ENDOSCOPIC PROCEDURE TODAY AT THE [facility]:   Refer to the procedure report that was given to you for any specific questions about what was found during the examination.  If the procedure report does not answer your questions, please call your gastroenterologist to clarify.  If you requested that your care partner not be given the details of your procedure findings, then the procedure report has been included in a sealed envelope for you to review at your convenience later.  YOU SHOULD EXPECT: Some feelings of bloating in the abdomen. Passage of more gas than usual.  Walking can help get rid of the air that was put into your GI tract during the procedure and reduce the bloating. If you had a lower endoscopy (such as a colonoscopy or flexible sigmoidoscopy) you may notice spotting of blood in your stool or on the toilet paper. If you underwent a bowel prep for your procedure, you may not have a normal bowel movement for a few days.  Please Note:  You might notice some irritation and congestion in your nose or some drainage.  This is from the oxygen used during your procedure.  There is no need for concern and it should clear up in a day or so.  SYMPTOMS TO REPORT IMMEDIATELY:  Following lower endoscopy (colonoscopy or flexible sigmoidoscopy):  Excessive amounts of blood in the stool  Significant tenderness or worsening of abdominal pains  Swelling of the abdomen that is new, acute  Fever of 100F or higher   For urgent or emergent issues, a gastroenterologist can be reached at any hour by calling [phone]. Do not use MyChart messaging for urgent concerns.    DIET:  We do recommend a small meal at first, but then you may proceed to your regular diet.  Drink plenty of fluids but you should avoid alcoholic beverages  for 24 hours.  ACTIVITY:  You should plan to take it easy for the rest of today and you should NOT DRIVE or use heavy machinery until tomorrow (because of the sedation medicines used during the test).    FOLLOW UP: Our staff will call the number listed on your records the next business day following your procedure.  We will call around 7:15- 8:00 am to check on you and address any questions or concerns that you may have regarding the information given to you following your procedure. If we do not reach you, we will leave a message.     If any biopsies were taken you will be contacted by phone or by letter within the next 1-3 weeks.  Please call us at [phone] if you have not heard about the biopsies in 3 weeks.    SIGNATURES/CONFIDENTIALITY: You and/or your care partner have signed paperwork which will be entered into your electronic medical record.  These signatures attest to the fact that that the information above on your After Visit Summary has been reviewed and is understood.  Full responsibility of the confidentiality of this discharge information lies with you and/or your care-partner.

## 2023-04-24 NOTE — Progress Notes (Signed)
GASTROENTEROLOGY PROCEDURE H&P NOTE   Primary Care Physician: Wilson Singer, MD (Inactive)    Reason for Procedure:  Colon Cancer screening  Plan:    Colonoscopy  Patient is appropriate for endoscopic procedure(s) in the ambulatory ([facility]) setting.  The nature of the procedure, as well as the risks, benefits, and alternatives were carefully and thoroughly reviewed with the patient. Ample time for discussion and questions allowed. The patient understood, was satisfied, and agreed to proceed.     HPI: [name] [name] is a 67 y.o. male who presents for colonoscopy for ongoing Colon Cancer screening.  No active GI symptoms.  No known family history of colon cancer or related malignancy.  Patient is otherwise without complaints or active issues today.  Last colonoscopy was approximately 2002 at outside facility and normal per patient. No record available for review in EMR. Has had negative Cologuard studies in the interim.   Past Medical History:  Diagnosis Date   Aortic aneurysm [facility])    Bladder outlet obstruction    BPH (benign prostatic hypertrophy)    BPH (benign prostatic hypertrophy) with urinary obstruction 06/11/2013   Frequency of urination    High cholesterol    Hypertension    Nocturia    Urgency of urination     Past Surgical History:  Procedure Laterality Date   GREEN LIGHT LASER TURP (TRANSURETHRAL RESECTION OF PROSTATE N/A 06/11/2013   Procedure: GREEN LIGHT LASER TURP (TRANSURETHRAL RESECTION OF PROSTATE;  Surgeon: Anner Crete, MD;  Location: [facility];  Service: Urology;  Laterality: N/A;   LUMBAR DISC SURGERY  1992   L4 -- L5    Prior to Admission medications   Medication Sig Start Date End Date Taking? Authorizing Provider  aspirin EC 81 MG tablet Take 81 mg by mouth daily. Swallow whole.    [provider]  Cholecalciferol (VITAMIN D-3) 125 MCG (5000 UT) TABS Take 2 tablets by mouth daily at 12 noon.    [provider]  losartan (COZAAR) 100 MG tablet Take 100 mg by mouth daily.    [provider]  magnesium gluconate (MAGONATE) 500 MG tablet Take 500 mg by mouth.    [provider]  rosuvastatin (CRESTOR) 10 MG tablet 1 tablet 03/14/22   [provider]  tadalafil (CIALIS) 20 MG tablet Take by mouth. 02/06/23   [provider]    Current Outpatient Medications  Medication Sig Dispense Refill   aspirin EC 81 MG tablet Take 81 mg by mouth daily. Swallow whole.     Cholecalciferol (VITAMIN D-3) 125 MCG (5000 UT) TABS Take 2 tablets by mouth daily at 12 noon.     losartan (COZAAR) 100 MG tablet Take 100 mg by mouth daily.     magnesium gluconate (MAGONATE) 500 MG tablet Take 500 mg by mouth.     rosuvastatin (CRESTOR) 10 MG tablet 1 tablet     tadalafil (CIALIS) 20 MG tablet Take by mouth.     No current facility-administered medications for this visit.    Allergies as of 04/24/2023   (No Known Allergies)    Family History  Problem Relation Age of Onset   Colon polyps Neg Hx    Crohn's disease Neg Hx    Esophageal cancer Neg Hx    Rectal cancer Neg Hx    Stomach cancer Neg Hx     Social History   Socioeconomic History   Marital status: Married    Spouse name: Not on  file   Number of children: Not on file   Years of education: Not on file   Highest education level: Not on file  Occupational History   Not on file  Tobacco Use   Smoking status: Never   Smokeless tobacco: Never  Vaping Use   Vaping status: Never Used  Substance and Sexual Activity   Alcohol use: Yes    Comment: OCCASIONAL   Drug use: No   Sexual activity: Not on file  Other Topics Concern   Not on file  Social History Narrative   Not on file   Social Determinants of Health   Financial Resource Strain: Not on file  Food Insecurity: Not on file  Transportation Needs: Not on file  Physical Activity: Not on file  Stress: Not on file  Social Connections: Not on file   Intimate Partner Violence: Not on file    Physical Exam: Vital signs in last 24 hours: @There  were no vitals taken for this visit. GEN: NAD EYE: Sclerae anicteric ENT: MMM CV: Non-tachycardic Pulm: CTA b/l GI: Soft, NT/ND NEURO:  Alert & Oriented x 3   Doristine Locks, DO [facility]   04/24/2023 10:30 AM

## 2023-04-24 NOTE — Progress Notes (Signed)
Patient denies having any polyps.  It's been 15 years since his last colonoscopy.

## 2023-04-24 NOTE — Progress Notes (Signed)
Called to room to assist during endoscopic procedure.  Patient ID and intended procedure confirmed with present staff. Received instructions for my participation in the procedure from the performing physician.  

## 2023-04-25 ENCOUNTER — Telehealth: Payer: Self-pay

## 2023-04-25 NOTE — Telephone Encounter (Signed)
  Follow up Call-     04/24/2023   10:52 AM 04/24/2023   10:48 AM  Call back number  Post procedure Call Back phone  # [phone]   Permission to leave phone message  Yes     Patient questions:  Do you have a fever, pain , or abdominal swelling? No. Pain Score  0 *  Have you tolerated food without any problems? Yes.    Have you been able to return to your normal activities? Yes.    Do you have any questions about your discharge instructions: Diet   No. Medications  No. Follow up visit  No.  Do you have questions or concerns about your Care? No.  Actions: * If pain score is 4 or above: No action needed, pain <4.

## 2023-04-30 ENCOUNTER — Ambulatory Visit (INDEPENDENT_AMBULATORY_CARE_PROVIDER_SITE_OTHER): Payer: Medicare Other | Admitting: Thoracic Surgery (Cardiothoracic Vascular Surgery)

## 2023-04-30 ENCOUNTER — Encounter: Payer: Self-pay | Admitting: Thoracic Surgery (Cardiothoracic Vascular Surgery)

## 2023-04-30 ENCOUNTER — Ambulatory Visit
Admission: RE | Admit: 2023-04-30 | Discharge: 2023-04-30 | Disposition: A | Discharge status: Home / Self Care | Payer: Medicare Other | Source: Ambulatory Visit | Attending: Thoracic Surgery (Cardiothoracic Vascular Surgery) | Admitting: Thoracic Surgery (Cardiothoracic Vascular Surgery)

## 2023-04-30 VITALS — BP 124/74 | HR 62 | Resp 18 | Ht 73.0 in | Wt 207.0 lb

## 2023-04-30 DIAGNOSIS — I10 Essential (primary) hypertension: Secondary | ICD-10-CM

## 2023-04-30 DIAGNOSIS — E785 Hyperlipidemia, unspecified: Secondary | ICD-10-CM

## 2023-04-30 DIAGNOSIS — I7121 Aneurysm of the ascending aorta, without rupture: Secondary | ICD-10-CM

## 2023-04-30 DIAGNOSIS — I712 Thoracic aortic aneurysm, without rupture, unspecified: Secondary | ICD-10-CM | POA: Diagnosis not present

## 2023-04-30 MED ORDER — IOPAMIDOL (ISOVUE-370) INJECTION 76%
75.0000 mL | Freq: Once | INTRAVENOUS | Status: AC | PRN
  Administered 2023-04-30: 75 mL via INTRAVENOUS

## 2023-04-30 NOTE — Progress Notes (Signed)
[address]       [place] [address]             [phone]     HPI: [name] returns for follow-up of his ascending aneurysm and left lower lobe lung nodule.  [name] [name] is a 67 year old gentleman with a past medical history significant for hypertension, dyslipidemia, ascending aortic aneurysm, left lower lobe lung nodule, and BPH.  He was found to have a dilated ascending aorta on a CT for coronary calcium screening about a year ago.  Estimated at 4.5 cm on that scan.  He had a follow-up CT 6 months later (January 2024) which showed the aneurysm was stable (although measured at 4.2 cm).  There also was a 7 mm left lower lobe lung nodule.  He has been feeling well.  He denies chest pain, pressure, tightness, or shortness of breath.  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  He is a non-smoker.  Past Medical History:  Diagnosis Date   Aortic aneurysm (HCC)    Bladder outlet obstruction    BPH (benign prostatic hypertrophy)    BPH (benign prostatic hypertrophy) with urinary obstruction 06/11/2013   Frequency of urination    High cholesterol    Hypertension    Nocturia    Urgency of urination     Current Outpatient Medications  Medication Sig Dispense Refill   aspirin EC 81 MG tablet Take 81 mg by mouth daily. Swallow whole.     Cholecalciferol (VITAMIN D-3) 125 MCG (5000 UT) TABS Take 2 tablets by mouth daily at 12 noon.     losartan (COZAAR) 100 MG tablet Take 100 mg by mouth daily.     magnesium gluconate (MAGONATE) 500 MG tablet Take 500 mg by mouth.     rosuvastatin (CRESTOR) 10 MG tablet 1 tablet     tadalafil (CIALIS) 20 MG tablet Take by mouth. (Patient not taking: Reported on 04/30/2023)     No current facility-administered medications for this visit.    Physical Exam BP 124/74 (BP Location: Left Arm, Patient Position: Sitting)   Pulse 62   Resp 18   Ht 6\' 1"  (1.854 m)   Wt 207 lb (93.9 kg)   SpO2 96% Comment: RA  BMI 27.39 kg/m   67 year old man in no acute distress Alert and oriented x 3, hard of hearing, no other focal deficits No cervical or supraclavicular adenopathy, no carotid bruits Cardiac regular rate and rhythm, no murmur Lungs clear with equal breath sounds bilaterally No clubbing, cyanosis or edema  Diagnostic Tests: CT ANGIOGRAPHY CHEST WITH CONTRAST   TECHNIQUE: Multidetector CT imaging of the chest was performed using the standard protocol during bolus administration of intravenous contrast. Multiplanar CT image reconstructions and MIPs were obtained to evaluate the vascular anatomy.   RADIATION DOSE REDUCTION: This exam was performed according to the departmental dose-optimization program which includes automated exposure control, adjustment of the mA and/or kV according to patient size and/or use of iterative reconstruction technique.   CONTRAST:  75mL ISOVUE-370 IOPAMIDOL (ISOVUE-370) INJECTION 76%   COMPARISON:  10/23/2022   FINDINGS: Cardiovascular: SVC patent. Heart size normal. Trace pericardial fluid. The RV is nondilated. Good contrast opacification of the pulmonary arterial tree with no convincing filling defects to suggest acute PE. LAD coronary calcifications. Good contrast opacification of the thoracic aorta without dissection or stenosis. Classic 3-vessel brachiocephalic arterial origin anatomy without proximal stenosis.   Aortic Root:   --Valve: 2.9 cm   --  Sinuses: 3.9 cm   --Sinotubular Junction: 3.4 cm   Limitations by motion: Moderate   Thoracic Aorta:   --Ascending Aorta: 4.4 cm (stable by my measurements since previous)   --Aortic Arch: 4 cm   --Descending Aorta: 3.2 cm   Mediastinum/Nodes: no mass or adenopathy.   Lungs/Pleura: No pleural effusion. No pneumothorax. Mild dependent atelectasis posteriorly in the lower lobes. Scattered small nodules in the left lower lobe and lingula largest 7 mm (Im77,Se6) stable since previous. No new nodule.    Upper Abdomen: No acute findings.   Musculoskeletal: Posterior right chest wall lipoma medial to the scapula as before. No acute or significant osseous findings.   Review of the MIP images confirms the above findings.   IMPRESSION: 1. Stable 4.4 cm ascending thoracic aortic aneurysm. Recommend annual imaging followup by CTA or MRA. This recommendation follows 2010 ACCF/AHA/AATS/ACR/ASA/SCA/SCAI/SIR/STS/SVM Guidelines for the Diagnosis and Management of Patients with Thoracic Aortic Disease. Circulation. 2010; 121: Z610-R604 2. Stable left lower lobe and lingular nodules. Follow-up CT at 18-24 months is considered optional for low-risk patients, but is recommended for high-risk patients. This recommendation follows the consensus statement: Guidelines for Management of Incidental Pulmonary Nodules Detected on CT Images: From the Fleischner Society 2017; Radiology 2017; 284:228-243. 3. Coronary and aortic Atherosclerosis (ICD10-I70.0).     Electronically Signed   By: Corlis Leak M.D.   On: 04/30/2023 12:51 I personally reviewed the CT images.  There is a 4.4 cm ascending aneurysm unchanged from his scans and July 23 and January 24.  7 mm lung nodule unchanged.  Impression: [name] [name] is a 67 year old gentleman with a past medical history significant for hypertension, dyslipidemia, ascending aortic aneurysm, left lower lobe lung nodule, and BPH.    Ascending aneurysm-stable at 4.4 cm.  Needs annual follow-up.  He is aware the importance of blood pressure control.  We did discuss the symptoms of dissection or rupture.  He knows to seek immediate medical attention should he experience severe chest or back pain.  Hypertension-blood pressure well-controlled on current regimen.  Encouraged him to check himself at home at least once a week.  Hyperlipidemia-on rosuvastatin.  Lung nodules-lifelong non-smoker.  Works on a farm.  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  Will  be followed as we follow his aneurysm.  Plan: Return in 1 year with CT angio of chest  Loreli Slot, MD [facility] [phone]

## 2023-11-13 ENCOUNTER — Encounter: Payer: Medicare Other | Admitting: Hematology

## 2023-11-13 ENCOUNTER — Encounter: Payer: Self-pay | Admitting: Oncology

## 2023-11-13 ENCOUNTER — Inpatient Hospital Stay: Payer: Medicare Other

## 2023-11-13 ENCOUNTER — Inpatient Hospital Stay: Payer: Medicare Other | Attending: Oncology | Admitting: Oncology

## 2023-11-13 VITALS — BP 124/76 | HR 59 | Temp 97.5°F | Resp 18 | Ht 73.0 in | Wt 213.0 lb

## 2023-11-13 DIAGNOSIS — D75839 Thrombocytosis, unspecified: Secondary | ICD-10-CM | POA: Insufficient documentation

## 2023-11-13 DIAGNOSIS — Z79899 Other long term (current) drug therapy: Secondary | ICD-10-CM | POA: Diagnosis not present

## 2023-11-13 DIAGNOSIS — D751 Secondary polycythemia: Secondary | ICD-10-CM | POA: Diagnosis not present

## 2023-11-13 DIAGNOSIS — D473 Essential (hemorrhagic) thrombocythemia: Secondary | ICD-10-CM | POA: Insufficient documentation

## 2023-11-13 LAB — CBC WITH DIFFERENTIAL/PLATELET
Abs Immature Granulocytes: 0.02 10*3/uL (ref 0.00–0.07)
Basophils Absolute: 0.1 10*3/uL (ref 0.0–0.1)
Basophils Relative: 1 %
Eosinophils Absolute: 0.2 10*3/uL (ref 0.0–0.5)
Eosinophils Relative: 3 %
HCT: 45.5 % (ref 39.0–52.0)
Hemoglobin: 14.9 g/dL (ref 13.0–17.0)
Immature Granulocytes: 0 %
Lymphocytes Relative: 22 %
Lymphs Abs: 1.4 10*3/uL (ref 0.7–4.0)
MCH: 30.2 pg (ref 26.0–34.0)
MCHC: 32.7 g/dL (ref 30.0–36.0)
MCV: 92.1 fL (ref 80.0–100.0)
Monocytes Absolute: 0.4 10*3/uL (ref 0.1–1.0)
Monocytes Relative: 7 %
Neutro Abs: 4.2 10*3/uL (ref 1.7–7.7)
Neutrophils Relative %: 67 %
Platelets: 485 10*3/uL — ABNORMAL HIGH (ref 150–400)
RBC: 4.94 MIL/uL (ref 4.22–5.81)
RDW: 13.5 % (ref 11.5–15.5)
WBC: 6.3 10*3/uL (ref 4.0–10.5)
nRBC: 0 % (ref 0.0–0.2)

## 2023-11-13 LAB — COMPREHENSIVE METABOLIC PANEL
ALT: 36 U/L (ref 0–44)
AST: 24 U/L (ref 15–41)
Albumin: 4.1 g/dL (ref 3.5–5.0)
Alkaline Phosphatase: 63 U/L (ref 38–126)
Anion gap: 7 (ref 5–15)
BUN: 20 mg/dL (ref 8–23)
CO2: 26 mmol/L (ref 22–32)
Calcium: 9.1 mg/dL (ref 8.9–10.3)
Chloride: 106 mmol/L (ref 98–111)
Creatinine, Ser: 0.67 mg/dL (ref 0.61–1.24)
GFR, Estimated: >60 mL/min (ref >60)
Glucose, Bld: 86 mg/dL (ref 70–99)
Potassium: 4 mmol/L (ref 3.5–5.1)
Sodium: 139 mmol/L (ref 135–145)
Total Bilirubin: 0.6 mg/dL (ref 0.0–1.2)
Total Protein: 7.2 g/dL (ref 6.5–8.1)

## 2023-11-13 LAB — RETICULOCYTES
Immature Retic Fract: 15 % (ref 2.3–15.9)
RBC.: 4.97 MIL/uL (ref 4.22–5.81)
Retic Count, Absolute: 93.4 10*3/uL (ref 19.0–186.0)
Retic Ct Pct: 1.9 % (ref 0.4–3.1)

## 2023-11-13 LAB — IRON AND TIBC
Iron: 93 ug/dL (ref 45–182)
Saturation Ratios: 31 % (ref 17.9–39.5)
TIBC: 305 ug/dL (ref 250–450)
UIBC: 212 ug/dL

## 2023-11-13 LAB — C-REACTIVE PROTEIN: CRP: 0.6 mg/dL (ref <1.0)

## 2023-11-13 LAB — SEDIMENTATION RATE: Sed Rate: 3 mm/h (ref 0–16)

## 2023-11-13 LAB — LACTATE DEHYDROGENASE: LDH: 148 U/L (ref 98–192)

## 2023-11-13 LAB — FERRITIN: Ferritin: 60 ng/mL (ref 24–336)

## 2023-11-13 NOTE — Assessment & Plan Note (Signed)
Elevated platelet count without clear etiology. No significant symptoms or family history. No history of splenectomy. Discussed potential causes including inflammation, iron deficiency, and blood disorders. -Order JAK2 mutation test to rule out myeloproliferative disorder. -Check iron levels and inflammatory markers. -Plan to review results in two weeks. -If results are normal, we will monitor platelet count every six months.

## 2023-11-13 NOTE — Progress Notes (Signed)
[facility] at [facility] HEMATOLOGY NEW VISIT  Wilson Singer, MD (Inactive)  REASON FOR REFERRAL: Thrombocytosis  HISTORY OF PRESENT ILLNESS: [name] [name] 68 y.o. male referred for thrombocytosis.  Patient has a history of hypogonadism and has been on testosterone shots for a little bit, was discontinued for polycythemia.  He reports no symptoms or complications at this time.He reports a good appetite and denies any recent unintentional weight loss, fever, chills, headaches or night sweats. He does report some itching on the back, which he attributes to dry skin.  He has no history of blood clots.  Patient has no history of splenectomy.  The patient denies any history of smoking and reports moderate alcohol consumption. 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  I have reviewed the past medical history, past surgical history, social history and family history with the patient   ALLERGIES:  has no known allergies.  MEDICATIONS:  Current Outpatient Medications  Medication Sig Dispense Refill   aspirin EC 81 MG tablet Take 81 mg by mouth daily. Swallow whole.     Cholecalciferol (VITAMIN D-3) 125 MCG (5000 UT) TABS Take 2 tablets by mouth daily at 12 noon.     losartan (COZAAR) 100 MG tablet Take 100 mg by mouth daily.     magnesium gluconate (MAGONATE) 500 MG tablet Take 500 mg by mouth.     rosuvastatin (CRESTOR) 10 MG tablet 1 tablet     tadalafil (CIALIS) 20 MG tablet Take by mouth.     No current facility-administered medications for this visit.     REVIEW OF SYSTEMS:   Constitutional: Denies fevers, chills or night sweats Eyes: Denies blurriness of vision Ears, nose, mouth, throat, and face: Denies mucositis or sore throat Respiratory: Denies cough, dyspnea or wheezes Cardiovascular: Denies palpitation, chest discomfort or lower extremity swelling Gastrointestinal:  Denies nausea, heartburn or change in bowel habits Skin:  Denies abnormal skin rashes Lymphatics: Denies new lymphadenopathy or easy bruising Neurological:Denies numbness, tingling or new weaknesses Behavioral/Psych: Mood is stable, no new changes  All other systems were reviewed with the patient and are negative.  PHYSICAL EXAMINATION:   Vitals:   11/13/23 1058  BP: 124/76  Pulse: (!) 59  Resp: 18  Temp: (!) 97.5 F (36.4 C)  SpO2: 98%    GENERAL:alert, no distress and comfortable SKIN: skin color, texture, turgor are normal, no rashes or significant lesions LUNGS: clear to auscultation and percussion with normal breathing effort HEART: regular rate & rhythm and no murmurs and no lower extremity edema ABDOMEN:abdomen soft, non-tender and normal bowel sounds Musculoskeletal:no cyanosis of digits and no clubbing  NEURO: alert & oriented x 3 with fluent speech  LABORATORY DATA:  I have reviewed the data as listed on labs from primary care from 10/31/2023  CBC: WBC: 7, hemoglobin: 15.4, hematocrit: 46 point, MCV: 91, platelets: 584  Lab Results  Component Value Date   WBC 6.3 11/13/2023   NEUTROABS 4.2 11/13/2023   HGB 14.9 11/13/2023   HCT 45.5 11/13/2023   MCV 92.1 11/13/2023   PLT 485 (H) 11/13/2023      Chemistry      Component Value Date/Time   NA 139 11/13/2023 1126   K 4.0 11/13/2023 1126   CL 106 11/13/2023 1126   CO2 26 11/13/2023 1126   BUN 20 11/13/2023 1126   CREATININE 0.67 11/13/2023 1126   CREATININE 0.97 04/18/2020 0935      Component Value  Date/Time   CALCIUM 9.1 11/13/2023 1126   ALKPHOS 63 11/13/2023 1126   AST 24 11/13/2023 1126   ALT 36 11/13/2023 1126   BILITOT 0.6 11/13/2023 1126      ASSESSMENT & PLAN:  Patient is a 68 year old male referred for thrombocytosis.    Thrombocytosis Elevated platelet count without clear etiology. No significant symptoms or family history. No history of splenectomy. Discussed potential causes including inflammation, iron deficiency, and blood disorders. -Order  JAK2 mutation test to rule out myeloproliferative disorder. -Check iron levels and inflammatory markers. -Plan to review results in two weeks. -If results are normal, we will monitor platelet count every six months.   Orders Placed This Encounter  Procedures   Ferritin    Standing Status:   Future    Number of Occurrences:   1    Expected Date:   11/13/2023    Expiration Date:   11/12/2024   CBC with Differential/Platelet    Standing Status:   Future    Number of Occurrences:   1    Expected Date:   11/13/2023    Expiration Date:   11/12/2024   Comprehensive metabolic panel    Standing Status:   Future    Number of Occurrences:   1    Expected Date:   11/13/2023    Expiration Date:   11/12/2024   Reticulocytes    Standing Status:   Future    Number of Occurrences:   1    Expected Date:   11/13/2023    Expiration Date:   11/12/2024   Lactate dehydrogenase    Standing Status:   Future    Number of Occurrences:   1    Expected Date:   11/13/2023    Expiration Date:   11/12/2024   Iron and TIBC    Standing Status:   Future    Number of Occurrences:   1    Expected Date:   11/13/2023    Expiration Date:   11/12/2024   Sedimentation rate    Standing Status:   Future    Number of Occurrences:   1    Expected Date:   11/13/2023    Expiration Date:   11/12/2024   C-reactive protein    Standing Status:   Future    Number of Occurrences:   1    Expected Date:   11/13/2023    Expiration Date:   11/12/2024   JAK2 V617F rfx CALR/MPL/E12-15    Standing Status:   Future    Number of Occurrences:   1    Expected Date:   11/13/2023    Expiration Date:   11/12/2024    The total time spent in the appointment was 40 minutes encounter with patients including review of chart and various tests results, discussions about plan of care and coordination of care plan   All questions were answered. The patient knows to call the clinic with any problems, questions or concerns. No barriers to learning was  detected.   Cindie Crumbly, MD 2/12/20254:05 PM

## 2023-11-13 NOTE — Patient Instructions (Signed)
VISIT SUMMARY:  Today, we discussed your recent blood work, which showed a high platelet count. You are not experiencing any symptoms related to this finding. We also reviewed your history of high red blood cell count, which was previously linked to testosterone use that you have since stopped.  YOUR PLAN:  -THROMBOCYTOSIS: Thrombocytosis means having a higher than normal number of platelets in your blood. This can be due to various reasons such as inflammation, iron deficiency, or blood disorders. We will conduct a JAK2 mutation test to rule out a specific type of blood disorder and check your iron levels and inflammatory markers. We will review the results in two weeks. If everything is normal, we will monitor your platelet count every six months.  -HISTORY OF ELEVATED RED BLOOD CELLS: Your previously high red blood cell count was due to testosterone use, which you have now discontinued. There are no current symptoms or concerns, so no further action is needed at this time.  INSTRUCTIONS:  Please complete the JAK2 mutation test and the tests for iron levels and inflammatory markers as soon as possible. We will review the results in two weeks. If the results are normal, we will monitor your platelet count every six months.

## 2023-11-18 LAB — JAK2 V617F RFX CALR/MPL/E12-15: JAK2 V617F %: 17.35 %

## 2023-11-27 ENCOUNTER — Inpatient Hospital Stay (HOSPITAL_BASED_OUTPATIENT_CLINIC_OR_DEPARTMENT_OTHER): Payer: Medicare Other | Admitting: Oncology

## 2023-11-27 ENCOUNTER — Inpatient Hospital Stay: Payer: Medicare Other

## 2023-11-27 VITALS — BP 119/74 | HR 56 | Temp 97.1°F | Resp 18 | Wt 212.6 lb

## 2023-11-27 DIAGNOSIS — D75839 Thrombocytosis, unspecified: Secondary | ICD-10-CM | POA: Diagnosis not present

## 2023-11-27 DIAGNOSIS — D751 Secondary polycythemia: Secondary | ICD-10-CM | POA: Diagnosis not present

## 2023-11-27 LAB — CBC WITH DIFFERENTIAL/PLATELET
Abs Immature Granulocytes: 0.03 10*3/uL (ref 0.00–0.07)
Basophils Absolute: 0.1 10*3/uL (ref 0.0–0.1)
Basophils Relative: 2 %
Eosinophils Absolute: 0.2 10*3/uL (ref 0.0–0.5)
Eosinophils Relative: 2 %
HCT: 44 % (ref 39.0–52.0)
Hemoglobin: 14.6 g/dL (ref 13.0–17.0)
Immature Granulocytes: 0 %
Lymphocytes Relative: 21 %
Lymphs Abs: 1.5 10*3/uL (ref 0.7–4.0)
MCH: 30.4 pg (ref 26.0–34.0)
MCHC: 33.2 g/dL (ref 30.0–36.0)
MCV: 91.7 fL (ref 80.0–100.0)
Monocytes Absolute: 0.6 10*3/uL (ref 0.1–1.0)
Monocytes Relative: 9 %
Neutro Abs: 4.8 10*3/uL (ref 1.7–7.7)
Neutrophils Relative %: 66 %
Platelets: 526 10*3/uL — ABNORMAL HIGH (ref 150–400)
RBC: 4.8 MIL/uL (ref 4.22–5.81)
RDW: 14 % (ref 11.5–15.5)
WBC: 7.2 10*3/uL (ref 4.0–10.5)
nRBC: 0 % (ref 0.0–0.2)

## 2023-11-27 NOTE — Progress Notes (Signed)
 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 Discussed the implications of the mutation and the potential for conditions such as polycythemia vera, essential thrombocythemia and myelofibrosis. No history of thrombosis. Currently on baby aspirin. -Schedule bone marrow biopsy to assess for fibrosis and to confirm diagnosis. -Rule out CML with BCR-ABL testing  -Follow-up in two weeks after biopsy to discuss results and potential treatment plan.  Polycythemia Patient had previous history of polycythemia with testosterone use.  Resolved after stopping testosterone.   Orders Placed This Encounter  Procedures   CT BONE MARROW BIOPSY & ASPIRATION    Patient with JAK2 positive thromboctytsis. Biopsy to rule out myelofibrosis. Please send flow cytometry, myeloid NGS and cytogenetics.    Standing Status:   Future    Expected Date:   11/27/2023    Expiration Date:   11/26/2024    Reason for Exam (SYMPTOM  OR DIAGNOSIS REQUIRED):   myeloma staging    Preferred imaging location?:   [facility]    Radiology Contrast Protocol - do NOT remove file path:   [email or web address]   BCR-ABL1, CML/ALL, PCR, QUANT    Standing Status:   Future    Number of Occurrences:   1    Expected Date:   11/27/2023    Expiration Date:   11/26/2024   Erythropoietin    Standing Status:   Future    Number of Occurrences:   1    Expected Date:   11/27/2023    Expiration Date:   11/26/2024   CBC with Differential/Platelet    Standing Status:   Future    Number of Occurrences:   1    Expected Date:   11/27/2023    Expiration Date:   11/26/2024    The total time spent in the appointment was 30 minutes encounter with patients  including review of chart and various tests results, discussions about plan of care and coordination of care plan   All questions were answered. The patient knows to call the clinic with any problems, questions or concerns. No barriers to learning was detected.  Cindie Crumbly, MD 2/26/20255:19 PM   SUMMARY OF HEMATOLOGIC HISTORY: -Thrombocytosis - 11/13/23: JAK2: Positive   INTERVAL HISTORY: [name] [name] 68 y.o. male following for thrombocytosis.  He reports no new complaints.  Denies fever, chills, weight loss, loss of appetite.  Patient currently takes a baby aspirin daily.  We discussed that his JAK2 mutation came back positive and he would need a bone marrow biopsy to rule out myelofibrosis.  Patient understands the risks and benefits and agrees to the plan.  I have reviewed the past medical history, past surgical history, social history and family history with the patient   ALLERGIES:  has no known allergies.  MEDICATIONS:  Current Outpatient Medications  Medication Sig Dispense Refill   aspirin EC 81 MG tablet Take 81 mg by mouth daily. Swallow whole.     Cholecalciferol (VITAMIN D-3) 125 MCG (5000 UT) TABS Take 2 tablets by mouth daily at 12 noon.     losartan (COZAAR) 100 MG tablet Take 100 mg by mouth daily.     magnesium gluconate (MAGONATE) 500 MG tablet Take 500 mg by mouth.     rosuvastatin (CRESTOR) 10 MG tablet 1 tablet  No current facility-administered medications for this visit.     REVIEW OF SYSTEMS:   Constitutional: Denies fevers, chills or night sweats Eyes: Denies blurriness of vision Ears, nose, mouth, throat, and face: Denies mucositis or sore throat Respiratory: Denies cough, dyspnea or wheezes Cardiovascular: Denies palpitation, chest discomfort or lower extremity swelling Gastrointestinal:  Denies nausea, heartburn or change in bowel habits Skin: Denies abnormal skin rashes Lymphatics: Denies new lymphadenopathy or easy  bruising Neurological:Denies numbness, tingling or new weaknesses Behavioral/Psych: Mood is stable, no new changes  All other systems were reviewed with the patient and are negative.  PHYSICAL EXAMINATION:   Vitals:   11/27/23 0903  BP: 119/74  Pulse: (!) 56  Resp: 18  Temp: (!) 97.1 F (36.2 C)  SpO2: 100%    GENERAL:alert, no distress and comfortable SKIN: skin color, texture, turgor are normal, no rashes or significant lesions EYES: normal, Conjunctiva are pink and non-injected, sclera clear OROPHARYNX:no exudate, no erythema and lips, buccal mucosa, and tongue normal  NECK: supple, thyroid normal size, non-tender, without nodularity LYMPH:  no palpable lymphadenopathy in the cervical, axillary or inguinal LUNGS: clear to auscultation and percussion with normal breathing effort HEART: regular rate & rhythm and no murmurs and no lower extremity edema ABDOMEN:abdomen soft, non-tender and normal bowel sounds Musculoskeletal:no cyanosis of digits and no clubbing  NEURO: alert & oriented x 3 with fluent speech, no focal motor/sensory deficits  LABORATORY DATA:  I have reviewed the data as listed  Lab Results  Component Value Date   WBC 7.2 11/27/2023   NEUTROABS 4.8 11/27/2023   HGB 14.6 11/27/2023   HCT 44.0 11/27/2023   MCV 91.7 11/27/2023   PLT 526 (H) 11/27/2023      Component Value Date/Time   NA 139 11/13/2023 1126   K 4.0 11/13/2023 1126   CL 106 11/13/2023 1126   CO2 26 11/13/2023 1126   GLUCOSE 86 11/13/2023 1126   BUN 20 11/13/2023 1126   CREATININE 0.67 11/13/2023 1126   CREATININE 0.97 04/18/2020 0935   CALCIUM 9.1 11/13/2023 1126   PROT 7.2 11/13/2023 1126   ALBUMIN 4.1 11/13/2023 1126   AST 24 11/13/2023 1126   ALT 36 11/13/2023 1126   ALKPHOS 63 11/13/2023 1126   BILITOT 0.6 11/13/2023 1126   GFRNONAA >60 11/13/2023 1126   GFRNONAA 83 04/18/2020 0935   GFRAA 96 04/18/2020 0935       Chemistry      Component Value Date/Time   NA 139  11/13/2023 1126   K 4.0 11/13/2023 1126   CL 106 11/13/2023 1126   CO2 26 11/13/2023 1126   BUN 20 11/13/2023 1126   CREATININE 0.67 11/13/2023 1126   CREATININE 0.97 04/18/2020 0935      Component Value Date/Time   CALCIUM 9.1 11/13/2023 1126   ALKPHOS 63 11/13/2023 1126   AST 24 11/13/2023 1126   ALT 36 11/13/2023 1126   BILITOT 0.6 11/13/2023 1126     JAK2 V617F Result: Positive   Latest Reference Range & Units 11/13/23 11:27  Iron 45 - 182 ug/dL 93  UIBC ug/dL 161  TIBC 096 - 045 ug/dL 409  Saturation Ratios 17.9 - 39.5 % 31  Ferritin 24 - 336 ng/mL 60  CRP <1.0 mg/dL 0.6    Latest Reference Range & Units 11/13/23 11:26  LDH 98 - 192 U/L 148    Latest Reference Range & Units 11/13/23 11:26  Sed Rate 0 - 16 mm/hr 3

## 2023-11-27 NOTE — Assessment & Plan Note (Signed)
 Patient had previous history of polycythemia with testosterone use.  Resolved after stopping testosterone.

## 2023-11-27 NOTE — Assessment & Plan Note (Signed)
 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 Discussed the implications of the mutation and the potential for conditions such as polycythemia vera, essential thrombocythemia and myelofibrosis. No history of thrombosis. Currently on baby aspirin. -Schedule bone marrow biopsy to assess for fibrosis and to confirm diagnosis. -Rule out CML with BCR-ABL testing  -Follow-up in two weeks after biopsy to discuss results and potential treatment plan.

## 2023-11-27 NOTE — Patient Instructions (Signed)
 VISIT SUMMARY:  You came in today for a follow-up regarding your high platelet count and history of high red blood cells. We discussed your positive JAK2 mutation test and its implications. You are currently taking baby aspirin and have no new complaints.  YOUR PLAN:  -JAK2 MUTATION: A JAK2 mutation can lead to conditions like polycythemia vera and essential thrombocytosis, which cause high blood cells and platelets. We will schedule a bone marrow biopsy to check for fibrosis and confirm the diagnosis. Depending on the biopsy results, we may consider additional medication to lower your platelet count. We will also order more blood tests to rule out other conditions. Please follow up in two weeks to discuss the biopsy results and potential treatment options.  INSTRUCTIONS:  Please schedule a bone marrow biopsy as soon as possible. Follow up in two weeks after the biopsy to discuss the results and potential treatment plan.

## 2023-11-28 LAB — ERYTHROPOIETIN: Erythropoietin: 6.6 m[IU]/mL (ref 2.6–18.5)

## 2023-12-02 LAB — BCR-ABL1, CML/ALL, PCR, QUANT
E1A2 Transcript: <0.0032 %
Interpretation (BCRAL):: NEGATIVE
b2a2 transcript: <0.0032 %
b3a2 transcript: <0.0032 %

## 2023-12-16 ENCOUNTER — Other Ambulatory Visit: Payer: Self-pay | Admitting: Physician Assistant

## 2023-12-16 ENCOUNTER — Other Ambulatory Visit: Payer: Self-pay | Admitting: Radiology

## 2023-12-16 DIAGNOSIS — Z01818 Encounter for other preprocedural examination: Secondary | ICD-10-CM

## 2023-12-16 NOTE — H&P (Signed)
 Chief Complaint: JAK2  positive thrombocytosis; referred for image guided bone marrow biopsy to assess for fibrosis and to confirm diagnosis.   Referring Provider(s): Burnard Leigh  Supervising Physician: Marliss Coots  Patient Status: [facility] - Out-pt  History of Present Illness: [name] [name] is a 68 y.o. male with PMH sig for aortic aneurysm, BOO, BPH, HLD, HTN presenting now with JAK2 + thrombocytosis. He is scheduled today for CT guided bone marrow biopsy to assess for fibrosis/confirm diagnosis.    Patient is Full Code  Past Medical History:  Diagnosis Date   Aortic aneurysm [facility])    Bladder outlet obstruction    BPH (benign prostatic hypertrophy)    BPH (benign prostatic hypertrophy) with urinary obstruction 06/11/2013   Frequency of urination    High cholesterol    Hypertension    Nocturia    Urgency of urination     Past Surgical History:  Procedure Laterality Date   GREEN LIGHT LASER TURP (TRANSURETHRAL RESECTION OF PROSTATE N/A 06/11/2013   Procedure: GREEN LIGHT LASER TURP (TRANSURETHRAL RESECTION OF PROSTATE;  Surgeon: Anner Crete, MD;  Location: [facility];  Service: Urology;  Laterality: N/A;   LUMBAR DISC SURGERY  1992   L4 -- L5    Allergies: Patient has no known allergies.  Medications: Prior to Admission medications   Medication Sig Start Date End Date Taking? Authorizing Provider  aspirin EC 81 MG tablet Take 81 mg by mouth daily. Swallow whole.    [provider]  Cholecalciferol (VITAMIN D-3) 125 MCG (5000 UT) TABS Take 2 tablets by mouth daily at 12 noon.    [provider]  losartan (COZAAR) 100 MG tablet Take 100 mg by mouth daily.    [provider]  magnesium gluconate (MAGONATE) 500 MG tablet Take 500 mg by mouth.    [provider]  rosuvastatin (CRESTOR) 10 MG tablet 1 tablet 03/14/22   [provider]     Family History  Problem Relation Age of Onset   Colon polyps Neg Hx     Crohn's disease Neg Hx    Esophageal cancer Neg Hx    Rectal cancer Neg Hx    Stomach cancer Neg Hx     Social History   Socioeconomic History   Marital status: Married    Spouse name: Not on file   Number of children: Not on file   Years of education: Not on file   Highest education level: Not on file  Occupational History   Not on file  Tobacco Use   Smoking status: Never   Smokeless tobacco: Never  Vaping Use   Vaping status: Never Used  Substance and Sexual Activity   Alcohol use: Yes    Comment: OCCASIONAL   Drug use: No   Sexual activity: Not on file  Other Topics Concern   Not on file  Social History Narrative   Not on file   Social Drivers of Health   Financial Resource Strain: Not on file  Food Insecurity: No Food Insecurity (11/13/2023)   Hunger Vital Sign    Worried About Running Out of Food in the Last Year: Never true    Ran Out of Food in the Last Year: Never true  Transportation Needs: No Transportation Needs (11/13/2023)   PRAPARE - Administrator, Civil Service (Medical): No    Lack of Transportation (Non-Medical): No  Physical Activity: Not on file  Stress: Not on file  Social Connections: Not  on file      Review of Systems: denies fever,HA,CP,dyspnea, cough, abd/back pain,N/V or bleeding  Vital Signs: Vitals:   12/17/23 0808  BP: 136/86  Pulse: 67  Resp: 18  Temp: 98.1 F (36.7 C)  SpO2: 98%      Advance Care Plan: no documents on file  Physical Exam: awake/alert; chest- CTA bilat; heart- RRR; abd-soft,+BS,NT; no LE edema  Imaging: No results found.  Labs:  CBC: Recent Labs    11/13/23 1126 11/27/23 0936  WBC 6.3 7.2  HGB 14.9 14.6  HCT 45.5 44.0  PLT 485* 526*    COAGS: No results for input(s): "INR", "APTT" in the last 8760 hours.  BMP: Recent Labs    11/13/23 1126  NA 139  K 4.0  CL 106  CO2 26  GLUCOSE 86  BUN 20  CALCIUM 9.1  CREATININE 0.67  GFRNONAA >60    LIVER FUNCTION  TESTS: Recent Labs    11/13/23 1126  BILITOT 0.6  AST 24  ALT 36  ALKPHOS 63  PROT 7.2  ALBUMIN 4.1    TUMOR MARKERS: No results for input(s): "AFPTM", "CEA", "CA199", "CHROMGRNA" in the last 8760 hours.  Assessment and Plan: 68 y.o. male with PMH sig for aortic aneurysm, BOO, BPH, HLD, HTN presenting now with JAK2 + thrombocytosis. He is scheduled today for CT guided bone marrow biopsy to assess for fibrosis/confirm diagnosis. Risks and benefits of procedure was discussed with the patient  including, but not limited to bleeding, infection, damage to adjacent structures or low yield requiring additional tests.  All of the questions were answered and there is agreement to proceed.  Consent signed and in chart.     Thank you for allowing our service to participate in [name] [name] 's care.  Electronically Signed: D. Jeananne Rama, PA-C   12/16/2023, 6:43 PM      I spent a total of  15 Minutes   in face to face in clinical consultation, greater than 50% of which was counseling/coordinating care for CT guided bone marrow biopsy

## 2023-12-17 ENCOUNTER — Ambulatory Visit (HOSPITAL_COMMUNITY)
Admission: RE | Admit: 2023-12-17 | Discharge: 2023-12-17 | Disposition: A | Discharge status: Home / Self Care | Payer: Medicare Other | Source: Ambulatory Visit

## 2023-12-17 ENCOUNTER — Ambulatory Visit (HOSPITAL_COMMUNITY)
Admission: RE | Admit: 2023-12-17 | Discharge: 2023-12-17 | Disposition: A | Discharge status: Home / Self Care | Payer: Medicare Other | Source: Ambulatory Visit | Attending: Oncology | Admitting: Oncology

## 2023-12-17 ENCOUNTER — Other Ambulatory Visit: Payer: Self-pay

## 2023-12-17 DIAGNOSIS — D473 Essential (hemorrhagic) thrombocythemia: Secondary | ICD-10-CM | POA: Insufficient documentation

## 2023-12-17 DIAGNOSIS — E785 Hyperlipidemia, unspecified: Secondary | ICD-10-CM | POA: Diagnosis not present

## 2023-12-17 DIAGNOSIS — N400 Enlarged prostate without lower urinary tract symptoms: Secondary | ICD-10-CM | POA: Insufficient documentation

## 2023-12-17 DIAGNOSIS — I10 Essential (primary) hypertension: Secondary | ICD-10-CM | POA: Insufficient documentation

## 2023-12-17 DIAGNOSIS — Z01818 Encounter for other preprocedural examination: Secondary | ICD-10-CM

## 2023-12-17 DIAGNOSIS — Z1379 Encounter for other screening for genetic and chromosomal anomalies: Secondary | ICD-10-CM | POA: Diagnosis not present

## 2023-12-17 DIAGNOSIS — D75839 Thrombocytosis, unspecified: Secondary | ICD-10-CM | POA: Insufficient documentation

## 2023-12-17 LAB — CBC WITH DIFFERENTIAL/PLATELET
Abs Immature Granulocytes: 0.03 10*3/uL (ref 0.00–0.07)
Basophils Absolute: 0.1 10*3/uL (ref 0.0–0.1)
Basophils Relative: 2 %
Eosinophils Absolute: 0.2 10*3/uL (ref 0.0–0.5)
Eosinophils Relative: 2 %
HCT: 44.4 % (ref 39.0–52.0)
Hemoglobin: 14.3 g/dL (ref 13.0–17.0)
Immature Granulocytes: 0 %
Lymphocytes Relative: 19 %
Lymphs Abs: 1.4 10*3/uL (ref 0.7–4.0)
MCH: 30 pg (ref 26.0–34.0)
MCHC: 32.2 g/dL (ref 30.0–36.0)
MCV: 93.3 fL (ref 80.0–100.0)
Monocytes Absolute: 0.5 10*3/uL (ref 0.1–1.0)
Monocytes Relative: 7 %
Neutro Abs: 5 10*3/uL (ref 1.7–7.7)
Neutrophils Relative %: 70 %
Platelets: 476 10*3/uL — ABNORMAL HIGH (ref 150–400)
RBC: 4.76 MIL/uL (ref 4.22–5.81)
RDW: 13.6 % (ref 11.5–15.5)
WBC: 7.2 10*3/uL (ref 4.0–10.5)
nRBC: 0 % (ref 0.0–0.2)

## 2023-12-17 MED ORDER — FENTANYL CITRATE (PF) 100 MCG/2ML IJ SOLN
INTRAMUSCULAR | Status: AC | PRN
  Administered 2023-12-17: 50 ug via INTRAVENOUS

## 2023-12-17 MED ORDER — MIDAZOLAM HCL 2 MG/2ML IJ SOLN
INTRAMUSCULAR | Status: AC | PRN
  Administered 2023-12-17: 1 mg via INTRAVENOUS

## 2023-12-17 MED ORDER — SODIUM CHLORIDE 0.9% FLUSH: 3.0000 mL | INTRAVENOUS | Status: DC | PRN

## 2023-12-17 MED ORDER — FLUMAZENIL 0.5 MG/5ML IV SOLN
INTRAVENOUS | Status: AC
  Filled 2023-12-17: qty 5

## 2023-12-17 MED ORDER — MIDAZOLAM HCL 2 MG/2ML IJ SOLN
INTRAMUSCULAR | Status: AC
  Filled 2023-12-17: qty 2

## 2023-12-17 MED ORDER — SODIUM CHLORIDE 0.9 % IV SOLN: INTRAVENOUS | Status: DC

## 2023-12-17 MED ORDER — FENTANYL CITRATE (PF) 100 MCG/2ML IJ SOLN
INTRAMUSCULAR | Status: AC
  Filled 2023-12-17: qty 2

## 2023-12-17 MED ORDER — NALOXONE HCL 0.4 MG/ML IJ SOLN
INTRAMUSCULAR | Status: AC
  Filled 2023-12-17: qty 1

## 2023-12-17 MED ORDER — LIDOCAINE HCL 1 % IJ SOLN
INTRAMUSCULAR | Status: AC | PRN
  Administered 2023-12-17: 10 mL via INTRADERMAL

## 2023-12-17 MED ORDER — SODIUM CHLORIDE 0.9% FLUSH: 3.0000 mL | Freq: Two times a day (BID) | INTRAVENOUS | Status: DC

## 2023-12-17 NOTE — Procedures (Signed)
Interventional Radiology Procedure Note  Procedure: CT guided aspirate and core biopsy of right iliac bone  Complications: None  Recommendations: - Bedrest supine x 1 hrs - Hydrocodone PRN  Pain - Follow biopsy results   Arel Tippen, MD   

## 2023-12-17 NOTE — Discharge Instructions (Signed)
 Please call Interventional Radiology clinic [phone] with any questions or concerns.  You may remove your dressing and shower tomorrow.   Bone Marrow Aspiration and Bone Marrow Biopsy, Adult, Care After This sheet gives you information about how to care for yourself after your procedure. Your health care provider may also give you more specific instructions. If you have problems or questions, contact your health care provider. What can I expect after the procedure? After the procedure, it is common to have: Mild pain and tenderness. Swelling. Bruising. Follow these instructions at home: Puncture site care Follow instructions from your health care provider about how to take care of the puncture site. Make sure you: Wash your hands with soap and water before and after you change your bandage (dressing). If soap and water are not available, use hand sanitizer. Change your dressing as told by your health care provider. Check your puncture site every day for signs of infection. Check for: More redness, swelling, or pain. Fluid or blood. Warmth. Pus or a bad smell.   Activity Return to your normal activities as told by your health care provider. Ask your health care provider what activities are safe for you. Do not lift anything that is heavier than 10 lb (4.5 kg), or the limit that you are told, until your health care provider says that it is safe. Do not drive for 24 hours if you were given a sedative during your procedure. General instructions Take over-the-counter and prescription medicines only as told by your health care provider. Do not take baths, swim, or use a hot tub until your health care provider approves. Ask your health care provider if you may take showers. You may only be allowed to take sponge baths. If directed, put ice on the affected area. To do this: Put ice in a plastic bag. Place a towel between your skin and the bag. Leave the ice on for 20 minutes, 2-3 times a  day. Keep all follow-up visits as told by your health care provider. This is important.   Contact a health care provider if: Your pain is not controlled with medicine. You have a fever. You have more redness, swelling, or pain around the puncture site. You have fluid or blood coming from the puncture site. Your puncture site feels warm to the touch. You have pus or a bad smell coming from the puncture site. Summary After the procedure, it is common to have mild pain, tenderness, swelling, and bruising. Follow instructions from your health care provider about how to take care of the puncture site and what activities are safe for you. Take over-the-counter and prescription medicines only as told by your health care provider. Contact a health care provider if you have any signs of infection, such as fluid or blood coming from the puncture site. This information is not intended to replace advice given to you by your health care provider. Make sure you discuss any questions you have with your health care provider. Document Revised: 02/03/2019 Document Reviewed: 02/03/2019 Elsevier Patient Education  2021 [employer] Inc[employer]   Moderate Conscious Sedation, Adult, Care After This sheet gives you information about how to care for yourself after your procedure. Your health care provider may also give you more specific instructions. If you have problems or questions, contact your health care provider. What can I expect after the procedure? After the procedure, it is common to have: Sleepiness for several hours. Impaired judgment for several hours. Difficulty with balance. Vomiting if you eat too  soon. Follow these instructions at home: For the time period you were told by your health care provider: Rest. Do not participate in activities where you could fall or become injured. Do not drive or use machinery. Do not drink alcohol. Do not take sleeping pills or medicines that cause drowsiness. Do not  make important decisions or sign legal documents. Do not take care of children on your own.      Eating and drinking Follow the diet recommended by your health care provider. Drink enough fluid to keep your urine pale yellow. If you vomit: Drink water, juice, or soup when you can drink without vomiting. Make sure you have little or no nausea before eating solid foods.   General instructions Take over-the-counter and prescription medicines only as told by your health care provider. Have a responsible adult stay with you for the time you are told. It is important to have someone help care for you until you are awake and alert. Do not smoke. Keep all follow-up visits as told by your health care provider. This is important. Contact a health care provider if: You are still sleepy or having trouble with balance after 24 hours. You feel light-headed. You keep feeling nauseous or you keep vomiting. You develop a rash. You have a fever. You have redness or swelling around the IV site. Get help right away if: You have trouble breathing. You have new-onset confusion at home. Summary After the procedure, it is common to feel sleepy, have impaired judgment, or feel nauseous if you eat too soon. Rest after you get home. Know the things you should not do after the procedure. Follow the diet recommended by your health care provider and drink enough fluid to keep your urine pale yellow. Get help right away if you have trouble breathing or new-onset confusion at home. This information is not intended to replace advice given to you by your health care provider. Make sure you discuss any questions you have with your health care provider. Document Revised: 01/15/2020 Document Reviewed: 08/13/2019 Elsevier Patient Education  2021 [employer].

## 2023-12-17 NOTE — Progress Notes (Signed)
 Patient discharged home with wife, iv removed, discharge instructions given to patients wife

## 2023-12-19 ENCOUNTER — Encounter: Payer: Self-pay | Admitting: Oncology

## 2023-12-19 LAB — SURGICAL PATHOLOGY

## 2023-12-25 ENCOUNTER — Encounter (HOSPITAL_COMMUNITY): Payer: Self-pay | Admitting: Oncology

## 2023-12-30 NOTE — Progress Notes (Unsigned)
 [facility]  HEMATOLOGY FOLLOW-UP VISIT  Wilson Singer, MD (Inactive)  REASON FOR FOLLOW-UP: JAK2 positive thrombocytosis  ASSESSMENT & PLAN:  68yo M following for thrombocytosis  No problem-specific Assessment & Plan notes found for this encounter.    No orders of the defined types were placed in this encounter.   The total time spent in the appointment was 30 minutes encounter with patients including review of chart and various tests results, discussions about plan of care and coordination of care plan   All questions were answered. The patient knows to call the clinic with any problems, questions or concerns. No barriers to learning was detected.  Cindie Crumbly, MD 3/31/202511:15 AM   SUMMARY OF HEMATOLOGIC HISTORY: -Thrombocytosis - 11/13/23: JAK2: Positive   INTERVAL HISTORY: [name] [name] 68 y.o. male following for thrombocytosis.  He reports no new complaints.  Denies fever, chills, weight loss, loss of appetite.  Patient currently takes a baby aspirin daily.  We discussed that his JAK2 mutation came back positive and he would need a bone marrow biopsy to rule out myelofibrosis.  Patient understands the risks and benefits and agrees to the plan.  I have reviewed the past medical history, past surgical history, social history and family history with the patient   ALLERGIES:  has no known allergies.  MEDICATIONS:  Current Outpatient Medications  Medication Sig Dispense Refill   aspirin EC 81 MG tablet Take 81 mg by mouth daily. Swallow whole.     Cholecalciferol (VITAMIN D-3) 125 MCG (5000 UT) TABS Take 2 tablets by mouth daily at 12 noon.     losartan (COZAAR) 100 MG tablet Take 100 mg by mouth daily.     magnesium gluconate (MAGONATE) 500 MG tablet Take 500 mg by mouth.     rosuvastatin (CRESTOR) 10 MG tablet 1 tablet     No current facility-administered medications for this visit.     REVIEW OF SYSTEMS:    Constitutional: Denies fevers, chills or night sweats Eyes: Denies blurriness of vision Ears, nose, mouth, throat, and face: Denies mucositis or sore throat Respiratory: Denies cough, dyspnea or wheezes Cardiovascular: Denies palpitation, chest discomfort or lower extremity swelling Gastrointestinal:  Denies nausea, heartburn or change in bowel habits Skin: Denies abnormal skin rashes Lymphatics: Denies new lymphadenopathy or easy bruising Neurological:Denies numbness, tingling or new weaknesses Behavioral/Psych: Mood is stable, no new changes  All other systems were reviewed with the patient and are negative.  PHYSICAL EXAMINATION:   There were no vitals filed for this visit.   GENERAL:alert, no distress and comfortable SKIN: skin color, texture, turgor are normal, no rashes or significant lesions EYES: normal, Conjunctiva are pink and non-injected, sclera clear OROPHARYNX:no exudate, no erythema and lips, buccal mucosa, and tongue normal  NECK: supple, thyroid normal size, non-tender, without nodularity LYMPH:  no palpable lymphadenopathy in the cervical, axillary or inguinal LUNGS: clear to auscultation and percussion with normal breathing effort HEART: regular rate & rhythm and no murmurs and no lower extremity edema ABDOMEN:abdomen soft, non-tender and normal bowel sounds Musculoskeletal:no cyanosis of digits and no clubbing  NEURO: alert & oriented x 3 with fluent speech, no focal motor/sensory deficits  LABORATORY DATA:  I have reviewed the data as listed  Lab Results  Component Value Date   WBC 7.2 12/17/2023   NEUTROABS 5.0 12/17/2023   HGB 14.3 12/17/2023   HCT 44.4 12/17/2023   MCV 93.3 12/17/2023   PLT 476 (H) 12/17/2023  Chemistry      Component Value Date/Time   NA 139 11/13/2023 1126   K 4.0 11/13/2023 1126   CL 106 11/13/2023 1126   CO2 26 11/13/2023 1126   BUN 20 11/13/2023 1126   CREATININE 0.67 11/13/2023 1126   CREATININE 0.97 04/18/2020  0935      Component Value Date/Time   CALCIUM 9.1 11/13/2023 1126   ALKPHOS 63 11/13/2023 1126   AST 24 11/13/2023 1126   ALT 36 11/13/2023 1126   BILITOT 0.6 11/13/2023 1126     JAK2 [id number] Result: Positive   Latest Reference Range & Units 11/13/23 11:27  Iron 45 - 182 ug/dL 93  UIBC ug/dL 161  TIBC 096 - 045 ug/dL 409  Saturation Ratios 17.9 - 39.5 % 31  Ferritin 24 - 336 ng/mL 60  CRP <1.0 mg/dL 0.6    Latest Reference Range & Units 11/13/23 11:26  LDH 98 - 192 U/L 148    Latest Reference Range & Units 11/13/23 11:26  Sed Rate 0 - 16 mm/hr 3   PATHOLOGY: 12/17/23:  BMBx: DIAGNOSIS:   BONE MARROW, ASPIRATE, CLOT, CORE:  -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  Correlation with cytogenetic studies is recommended.   Storage Iron: Present  Ring Sideroblasts: Absent   Flow cytometry:  DIAGNOSIS:   -No significant CD34 positive blastic population identified  -No monoclonal B-cell population or significant T-cell abnormalities  identified   GATING AND PHENOTYPIC ANALYSIS:   Gated population: Flow cytometric immunophenotyping is performed using  antibodies to the antigens listed in the table below. Electronic gates  are placed around a cell cluster displaying light scatter properties  corresponding to: lymphocytes, blasts   Abnormal Cells in gated population: N/A   Phenotype of Abnormal Cells: N/A   Cytogenetics: Normal male karyotype

## 2023-12-31 ENCOUNTER — Inpatient Hospital Stay

## 2023-12-31 ENCOUNTER — Inpatient Hospital Stay: Payer: Medicare Other | Attending: Oncology | Admitting: Oncology

## 2023-12-31 VITALS — BP 128/76 | HR 61 | Temp 97.7°F | Resp 18 | Ht 73.0 in | Wt 210.8 lb

## 2023-12-31 DIAGNOSIS — D473 Essential (hemorrhagic) thrombocythemia: Secondary | ICD-10-CM

## 2023-12-31 DIAGNOSIS — D75839 Thrombocytosis, unspecified: Secondary | ICD-10-CM | POA: Insufficient documentation

## 2023-12-31 DIAGNOSIS — Z79899 Other long term (current) drug therapy: Secondary | ICD-10-CM | POA: Insufficient documentation

## 2023-12-31 NOTE — Patient Instructions (Signed)
 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 You are currently asymptomatic and have no complaints. Your spouse was present and actively participated in the discussion.  YOUR PLAN:  -ESSENTIAL THROMBOCYTHEMIA (ET): Essential Thrombocythemia (ET) is a condition where your bone marrow produces too many platelets, increasing the risk of blood clots. This is due to a JAK2 mutation. We will continue your low-dose aspirin therapy and refer you to [facility] for evaluation for a clinical trial comparing a new medication with hydroxyurea. If you do not participate in the trial, we will start you on hydroxyurea to control your platelet count. We will monitor your blood counts monthly at first, then every three months based on your response. Be aware of potential side effects of hydroxyurea, including decreased white blood cell count, skin reactions, and headaches.  INSTRUCTIONS:  You have been referred to [facility] for a clinical trial evaluation. Please schedule a follow-up appointment after the [facility] evaluation to discuss your treatment options and next steps. Additionally, we will perform blood tests today to rule out chronic myeloid leukemia (CML).

## 2023-12-31 NOTE — Assessment & Plan Note (Signed)
 Diagnosed with essential thrombocythemia, a myeloproliferative neoplasm characterized by elevated platelet count due to JAK2 mutation. Age over 49 and JAK2 mutation classify him as high risk for thrombotic events.  Bone marrow biopsy results as below. -Will send BCR-ABL and myeloid NGS today to rule out CML. -Discussed the treatment entails aspirin and hydroxyurea.  Side effects of hydroxyurea discussed in detail including immunosuppression, skin reactions and headaches. -Discussed that clinical trial is a very good viable option and [facility] has a clinical trial evaluating bomedemstat versus hydroxyurea in the first-line.  Patient is agreeable for evaluation.  Will refer to [facility] for the clinical trial evaluation.  I reached out to Dr. Doneen Poisson with the PI of the study. -Continue low-dose aspirin  Recommend follow-up after evaluation at [facility] to discuss further management.

## 2024-01-03 LAB — BCR-ABL1, CML/ALL, PCR, QUANT
E1A2 Transcript: <0.0032 %
Interpretation (BCRAL):: NEGATIVE
b2a2 transcript: <0.0032 %
b3a2 transcript: <0.0032 %

## 2024-01-16 LAB — INTELLIGEN MYELOID

## 2024-01-28 ENCOUNTER — Inpatient Hospital Stay (HOSPITAL_BASED_OUTPATIENT_CLINIC_OR_DEPARTMENT_OTHER): Admitting: Oncology

## 2024-01-28 VITALS — BP 130/75 | HR 56 | Temp 97.9°F | Resp 18 | Wt 209.4 lb

## 2024-01-28 DIAGNOSIS — D473 Essential (hemorrhagic) thrombocythemia: Secondary | ICD-10-CM | POA: Diagnosis not present

## 2024-01-28 NOTE — Progress Notes (Signed)
 [facility] at [facility]  HEMATOLOGY FOLLOW-UP VISIT  [name] Raddle, MD (Inactive)  REASON FOR FOLLOW-UP: JAK2 positive thrombocytosis  ASSESSMENT & PLAN:  68yo M following for essential thrombocythemia.   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 Age over 33 and JAK2 mutation classify him as high risk for thrombotic events.   Bone marrow biopsy results as below-consistent with essential thrombocythemia BCR-ABL: Negative Myeloid NGS: Positive for JAK2 mutation  -Patient was evaluated by Dr. Leatrice Provost at [facility] for clinical trial of first-line treatment with bomedemstat versus hydroxyurea.  Patient is otherwise doing well he called today to ask a few questions and confirm a few logistics. -At this time he is leaning towards participation in the clinical trial -I will discuss with Dr. Boni Busman and see if there is anything that needs to be done locally -Continue low-dose aspirin  Recommended patient to call us  back after making a decision.  We will determine follow-up in clinic based on that.  I will reach out to Dr.Rein to see if he has any needs locally.   The total time spent in the appointment was 15 minutes encounter with patients including review of chart and various tests results, discussions about plan of care and coordination of care plan   All questions were answered. The patient knows to call the clinic with any problems, questions or concerns. No barriers to learning was detected.  Eduardo Grade, MD 4/29/20253:57 PM   SUMMARY OF HEMATOLOGIC HISTORY: -Thrombocytosis - 11/13/23: JAK2: Positive - BMBx: Consistent with diagnosis of ET  - BCR-ABL1: Negative - Myeloid NGS: Positive for JAK2 mutation   INTERVAL HISTORY: [name] [name] 68 y.o. male following for essential thrombocythemia. The patient is asymptomatic  and reports no complaints.  Patient was evaluated by Dr.Rein at [facility] for clinical trial for essential thrombocythemia.  He is considering participation at but is contemplating the logistics of traveling for the trauma and possibility of coordinating some aspects of care locally.  He has no new complaints at this time.  I have reviewed the past medical history, past surgical history, social history and family history with the patient   ALLERGIES:  has no known allergies.  MEDICATIONS:  Current Outpatient Medications  Medication Sig Dispense Refill   aspirin EC 81 MG tablet Take 81 mg by mouth daily. Swallow whole.     Cholecalciferol (VITAMIN D-3) 125 MCG (5000 UT) TABS Take 2 tablets by mouth daily at 12 noon.     GLUCOSAMINE HCL PO Take by mouth.     losartan (COZAAR) 100 MG tablet Take 100 mg by mouth daily.     magnesium gluconate (MAGONATE) 500 MG tablet Take 500 mg by mouth.     rosuvastatin (CRESTOR) 10 MG tablet 1 tablet     No current facility-administered medications for this visit.     REVIEW OF SYSTEMS:   Constitutional: Denies fevers, chills or night sweats Eyes: Denies blurriness of vision Ears, nose, mouth, throat, and face: Denies mucositis or sore throat Respiratory: Denies cough, dyspnea or wheezes Cardiovascular: Denies palpitation, chest discomfort or lower extremity swelling Gastrointestinal:  Denies nausea, heartburn or change in bowel habits Skin: Denies abnormal skin rashes Lymphatics: Denies new lymphadenopathy or easy bruising Neurological:Denies numbness, tingling or new weaknesses Behavioral/Psych: Mood is stable, no new changes  All other systems were reviewed with the patient and are negative.  PHYSICAL EXAMINATION:   Vitals:  01/28/24 0914  BP: 130/75  Pulse: (!) 56  Resp: 18  Temp: 97.9 F (36.6 C)  SpO2: 97%    GENERAL:alert, no distress and comfortable LYMPH:  no palpable lymphadenopathy in the cervical, axillary or  inguinal LUNGS: clear to auscultation and percussion with normal breathing effort HEART: regular rate & rhythm and no murmurs and no lower extremity edema ABDOMEN:abdomen soft, non-tender and normal bowel sounds Musculoskeletal:no cyanosis of digits and no clubbing  NEURO: alert & oriented x 3 with fluent speech  LABORATORY DATA:  I have reviewed the data as listed  Lab Results  Component Value Date   WBC 7.2 12/17/2023   NEUTROABS 5.0 12/17/2023   HGB 14.3 12/17/2023   HCT 44.4 12/17/2023   MCV 93.3 12/17/2023   PLT 476 (H) 12/17/2023       Chemistry      Component Value Date/Time   NA 139 11/13/2023 1126   K 4.0 11/13/2023 1126   CL 106 11/13/2023 1126   CO2 26 11/13/2023 1126   BUN 20 11/13/2023 1126   CREATININE 0.67 11/13/2023 1126   CREATININE 0.97 04/18/2020 0935      Component Value Date/Time   CALCIUM 9.1 11/13/2023 1126   ALKPHOS 63 11/13/2023 1126   AST 24 11/13/2023 1126   ALT 36 11/13/2023 1126   BILITOT 0.6 11/13/2023 1126     JAK2 V617F Result: Positive   Latest Reference Range & Units 11/13/23 11:27  Iron 45 - 182 ug/dL 93  UIBC ug/dL 213  TIBC 086 - 578 ug/dL 469  Saturation Ratios 17.9 - 39.5 % 31  Ferritin 24 - 336 ng/mL 60  CRP <1.0 mg/dL 0.6    Latest Reference Range & Units 11/13/23 11:26  LDH 98 - 192 U/L 148    Latest Reference Range & Units 11/13/23 11:26  Sed Rate 0 - 16 mm/hr 3   PATHOLOGY: 12/17/23:  BMBx: DIAGNOSIS:   BONE MARROW, ASPIRATE, CLOT, CORE:  -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  Correlation with cytogenetic studies is recommended.   Storage Iron: Present  Ring Sideroblasts: Absent   Flow cytometry:   DIAGNOSIS:   -No significant CD34 positive blastic population identified  -No monoclonal B-cell population or significant T-cell abnormalities  identified   GATING AND PHENOTYPIC ANALYSIS:   Gated population: Flow cytometric immunophenotyping is performed using antibodies to the antigens listed in the table below. Electronic gates  are placed around a cell cluster displaying light scatter properties corresponding to: lymphocytes, blasts   Abnormal Cells in gated population: N/A   Phenotype of Abnormal Cells: N/A   Cytogenetics: Normal male karyotype   Latest Reference Range & Units 12/31/23 09:34  b2a2 transcript % <0.0032 %  b3a2 transcript % <0.0032 %  E1A2 Transcript % <0.0032 %  Interpretation (BCRAL):  Negative  Director Review (BCRAL):  Comment  References (BCRAL)  Comment  BCR-ABL1, CML/ALL, PCR, QUANT  Rpt (C)  (C): Corrected Rpt: View report in Results Review for more information  Myeloid NGS: JAK2 c.1849G>T (p.V617F) is located in exon 14 of  transcript [id number] for the gene JAK2 on chromosome 9.

## 2024-01-28 NOTE — Assessment & Plan Note (Signed)
 Diagnosed with essential thrombocythemia, a myeloproliferative neoplasm characterized by elevated platelet count due to JAK2 mutation. Age over 45 and JAK2 mutation classify him as high risk for thrombotic events.   Bone marrow biopsy results as below-consistent with essential thrombocythemia BCR-ABL: Negative Myeloid NGS: Positive for JAK2 mutation  -Patient was evaluated by Dr. Leatrice Provost at [facility] for clinical trial of first-line treatment with bomedemstat versus hydroxyurea.  Patient is otherwise doing well he called today to ask a few questions and confirm a few logistics. -At this time he is leaning towards participation in the clinical trial -I will discuss with Dr. Boni Busman and see if there is anything that needs to be done locally -Continue low-dose aspirin  Recommended patient to call us  back after making a decision.  We will determine follow-up in clinic based on that.  I will reach out to Dr.Rein to see if he has any needs locally.

## 2024-03-05 ENCOUNTER — Other Ambulatory Visit: Payer: Self-pay | Admitting: Thoracic Surgery (Cardiothoracic Vascular Surgery)

## 2024-03-05 DIAGNOSIS — I7121 Aneurysm of the ascending aorta, without rupture: Secondary | ICD-10-CM

## 2024-03-26 ENCOUNTER — Ambulatory Visit: Admission: EM | Admit: 2024-03-26 | Discharge: 2024-03-26 | Disposition: A | Discharge status: Home / Self Care

## 2024-03-26 ENCOUNTER — Encounter: Payer: Self-pay | Admitting: Emergency Medicine

## 2024-03-26 DIAGNOSIS — T25221A Burn of second degree of right foot, initial encounter: Secondary | ICD-10-CM

## 2024-03-26 MED ORDER — SILVER SULFADIAZINE 1 % EX CREA: TOPICAL_CREAM | Freq: Once | CUTANEOUS | Status: AC

## 2024-03-26 MED ORDER — SILVER SULFADIAZINE 1 % EX CREA: 1.0000 | TOPICAL_CREAM | Freq: Every day | CUTANEOUS | 0 refills | Status: DC

## 2024-03-26 NOTE — Discharge Instructions (Addendum)
 Keep the dressing in place until tomorrow.  You may remove the dressing and cleanse the area with warm water.  Do not scrub or disrupt the area.  Apply medication as prescribed. Wear shoes that are loose fitting that do not rub against the side of your foot. Avoid close contact with pets while symptoms persist. Monitor the area for worsening to include increased redness, swelling, or foul-smelling drainage from the site.  If you develop any of the symptoms, please seek care immediately. Follow-up as needed.
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
## 2024-04-21 ENCOUNTER — Ambulatory Visit (HOSPITAL_COMMUNITY)
Admission: RE | Admit: 2024-04-21 | Discharge: 2024-04-21 | Disposition: A | Discharge status: Home / Self Care | Source: Ambulatory Visit | Attending: Thoracic Surgery (Cardiothoracic Vascular Surgery) | Admitting: Thoracic Surgery (Cardiothoracic Vascular Surgery)

## 2024-04-21 DIAGNOSIS — I7121 Aneurysm of the ascending aorta, without rupture: Secondary | ICD-10-CM | POA: Diagnosis present

## 2024-04-21 DIAGNOSIS — R918 Other nonspecific abnormal finding of lung field: Secondary | ICD-10-CM | POA: Insufficient documentation

## 2024-04-21 DIAGNOSIS — I517 Cardiomegaly: Secondary | ICD-10-CM | POA: Diagnosis not present

## 2024-04-21 DIAGNOSIS — K449 Diaphragmatic hernia without obstruction or gangrene: Secondary | ICD-10-CM | POA: Insufficient documentation

## 2024-04-21 MED ORDER — IOHEXOL 350 MG/ML SOLN
75.0000 mL | Freq: Once | INTRAVENOUS | Status: AC | PRN
  Administered 2024-04-21: 75 mL via INTRAVENOUS

## 2024-04-27 ENCOUNTER — Ambulatory Visit

## 2024-04-27 VITALS — BP 114/74 | HR 62 | Resp 18 | Ht 73.0 in | Wt 209.0 lb

## 2024-04-27 DIAGNOSIS — I7121 Aneurysm of the ascending aorta, without rupture: Secondary | ICD-10-CM | POA: Diagnosis present

## 2024-04-27 NOTE — Progress Notes (Signed)
 [address] [place] [address]             [phone]            CODEN FRANCHI [id number] 06/22/1956   History of Present Illness:  [name] is a 68 year old man with medical history of hypertension, BPH, hyperlipidemia, and essential thrombocythemia presents for continued surveillance of this ascending thoracic aortic aneurysm.  Aneurysm was found in 2023 on a calcium score CT scan.  Since he has been screening with CTA of the chest.  The aneurysm has measured between 4.2 cm- 4.5 cm over the last 2 years.   Most recent CTA scan on 04/21/2024 showed aneurysm at 4.5 cm. He has had an echocardiogram on 10/2022 which showed normal aortic valve with mild aortic regurgitation.   He has been doing well.  His blood pressure is well controlled with losartan.  He does not check his blood pressure at home but at office visits reports readings in 120s/70.  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  He does endorse lifting items that are roughly 50 lbs but nothing heavier. He denies a personal history of connective tissue disorders. His father had an ascending thoracic aortic aneurysm at the age of 66 which was repaired.  He denies chest pain, shortness of breath, chest tightness and lower leg swelling.     Current Outpatient Medications on File Prior to Visit  Medication Sig Dispense Refill   aspirin EC 81 MG tablet Take 81 mg by mouth daily. Swallow whole.     Cholecalciferol (VITAMIN D-3) 125 MCG (5000 UT) TABS Take 2 tablets by mouth daily at 12 noon.     GLUCOSAMINE HCL PO Take by mouth.     hydroxyurea (HYDREA) 500 MG capsule Take 500 mg by mouth daily. May take with food to minimize GI side effects.     losartan (COZAAR) 100 MG tablet Take 100 mg by mouth daily.     magnesium gluconate (MAGONATE) 500 MG tablet Take 500 mg by mouth.     rosuvastatin (CRESTOR) 10 MG tablet 1 tablet     silver  sulfADIAZINE  (SILVADENE ) 1 % cream Apply 1  Application topically daily. 100 g 0   No current facility-administered medications on file prior to visit.     ROS: Review of Systems  Constitutional: Negative.  Negative for malaise/fatigue and weight loss.  Respiratory: Negative.  Negative for cough and shortness of breath.   Cardiovascular: Negative.  Negative for chest pain and leg swelling.     BP 114/74   Pulse 62   Resp 18   Ht 6' 1 (1.854 m)   Wt 209 lb (94.8 kg)   SpO2 98%   BMI 27.57 kg/m   Physical Exam Constitutional:      Appearance: Normal appearance.  HENT:     Head: Normocephalic and atraumatic.  Cardiovascular:     Rate and Rhythm: Normal rate and regular rhythm.     Heart sounds: Normal heart sounds, S1 normal and S2 normal.  Pulmonary:     Effort: Pulmonary effort is normal.     Breath sounds: Normal breath sounds.  Skin:    General: Skin is warm and dry.  Neurological:     General: No focal deficit present.     Mental Status: He is alert and oriented to person, place, and time.      Imaging:  CLINICAL DATA:  Follow-up thoracic aortic aneurysm.   EXAM: CT ANGIOGRAPHY CHEST WITH CONTRAST   TECHNIQUE: Multidetector CT imaging of the chest was performed using the standard protocol during bolus administration of intravenous contrast. Multiplanar CT image reconstructions and MIPs were obtained to evaluate the vascular anatomy.   RADIATION DOSE REDUCTION: This exam was performed according to the departmental dose-optimization program which includes automated exposure control, adjustment of the mA and/or kV according to patient size and/or use of iterative reconstruction technique.   CONTRAST:  75mL OMNIPAQUE  IOHEXOL  350 MG/ML SOLN   COMPARISON:  04/30/2023   FINDINGS: Cardiovascular: 4.5 cm ascending thoracic aortic aneurysm remains stable. No evidence of thoracic aortic dissection, mediastinal hematoma or pleural effusion. Stable mild cardiomegaly. No evidence of pulmonary embolism.    Mediastinum/Nodes: No masses or pathologically enlarged lymph nodes identified. Wall thickening involving the lower thoracic esophagus is suspicious for esophagitis.   Lungs/Pleura: Multiple scattered sub-cm pulmonary nodules are seen in the peripheral lung zones bilaterally, which are stable. Largest in the posterior lingula measures 9 mm on image 289/3 a 1, unchanged. No new or enlarging pulmonary nodules or masses identified. No No evidence of pulmonary infiltrate or pleural effusion.   Upper abdomen: Small hiatal hernia again seen.   Musculoskeletal: No suspicious bone lesions identified.   Review of the MIP images confirms the above findings.   IMPRESSION: Stable 4.5 cm ascending thoracic aortic aneurysm. Recommend semi-annual imaging followup by CTA and referral to cardiothoracic surgery if not already obtained. This recommendation follows 2010 ACCF/AHA/AATS/ACR/ASA/SCA/SCAI/SIR/STS/SVM Guidelines for the Diagnosis and Management of Patients With Thoracic Aortic Disease. Circulation. 2010; 121: Z733-z630. Aortic aneurysm NOS (ICD10-I71.9)   Stable mild cardiomegaly.   Stable small hiatal hernia. Wall thickening involving the lower thoracic esophagus, suspicious for esophagitis.   Stable sub-cm bilateral pulmonary nodules, likely benign. Continued follow-up by chest CT in 12 months is recommended. This recommendation follows the consensus statement: Guidelines for Management of Incidental Pulmonary Nodules Detected on CT Images: From the Fleischner Society 2017; Radiology 2017; 284:228-243.     Electronically Signed   By: Norleen DELENA Kil M.D.   On: 04/22/2024 12:44     A/P: Aneurysm of ascending aorta without rupture (HCC) -4.5 cm ascending thoracic aortic aneurysm measured on CTA of chest.  Echocardiogram in 2024 showed normal aortic valve with mild aortic regurgitation and no aortic stenosis. Pulmonary nodules present bilaterally and stable based on previous scan.   Will continue to monitor with scans for aneurysm. We discussed the natural history and and risk factors for growth of ascending aortic aneurysms. Discussed recommendations to minimize the risk of further expansion or dissection including careful blood pressure control, avoidance of contact sports and heavy lifting, attention to lipid management.  We covered the importance of staying never user of tobacco.  The patient does not yet meet surgical criteria of >5.5cm. The patient is aware of signs and symptoms of aortic dissection and when to present to the emergency department   -Follow up in 6 months for continued surveillance of aneurysm  Risk Modification:  Statin:  rosuvastatin 10 mg  Smoking cessation instruction/counseling given:  never smoker  Patient was counseled on importance of Blood Pressure Control. They are instructed to contact their Primary Care Physician if they start to have blood pressure readings over 130s/90s. Do not ever stop blood pressure medications on your own, unless instructed by healthcare professional.  Please avoid use of Fluoroquinolones as this can potentially increase your risk of Aortic Rupture and/or Dissection  Patient educated on signs and symptoms of Aortic Dissection, handout also provided in AVS  Manuelita CHRISTELLA Rough, PA-C 04/27/24

## 2024-04-27 NOTE — Patient Instructions (Signed)

## 2024-04-28 ENCOUNTER — Ambulatory Visit: Admitting: Thoracic Surgery (Cardiothoracic Vascular Surgery)

## 2024-04-29 ENCOUNTER — Telehealth: Payer: Self-pay | Admitting: *Deleted

## 2024-04-29 NOTE — Telephone Encounter (Signed)
 Per call from pt, cancel tomorrow's follow up. Pt did not wish to r/s at this time. Stated he was in a clinical trial at [facility] and did not see the need in coming here.

## 2024-04-30 ENCOUNTER — Inpatient Hospital Stay: Admitting: Oncology

## 2024-05-21 ENCOUNTER — Ambulatory Visit (INDEPENDENT_AMBULATORY_CARE_PROVIDER_SITE_OTHER): Payer: Medicare Other | Admitting: Otolaryngology

## 2024-05-23 IMAGING — CT CT CARDIAC CORONARY ARTERY CALCIUM SCORE
3 series · 14 of 20 positions shown, 16 images · non-contrast
Comparison: None Available.

CLINICAL DATA: Mixed hyperlipidemia

EXAM:
CT CARDIAC CORONARY ARTERY CALCIUM SCORE
TECHNIQUE: Non-contrast imaging through the heart was performed using
prospective ECG gating. Image post processing was performed on an
independent workstation, allowing for quantitative analysis of the
heart and coronary arteries. Note that this exam targets the heart
and the chest was not imaged in its entirety.

[Series 2: calcium scoring 2.00 qr36 bestdiast 71% hrt calciu · axial · 0.41mm/px · z∈[+1487,+1583]mm · 4 of 80 slices shown]
[im 16/80  vessel]
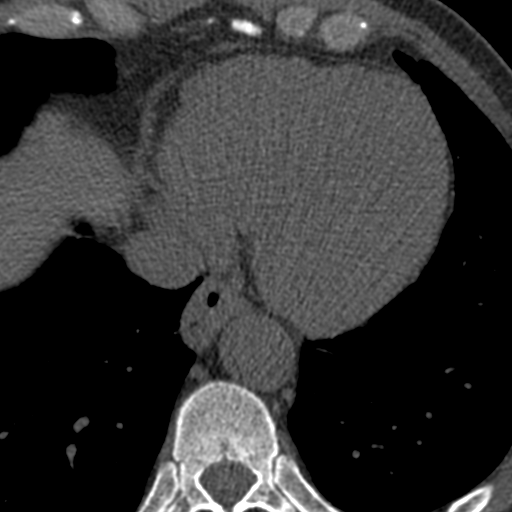
[im 32/80  vessel]
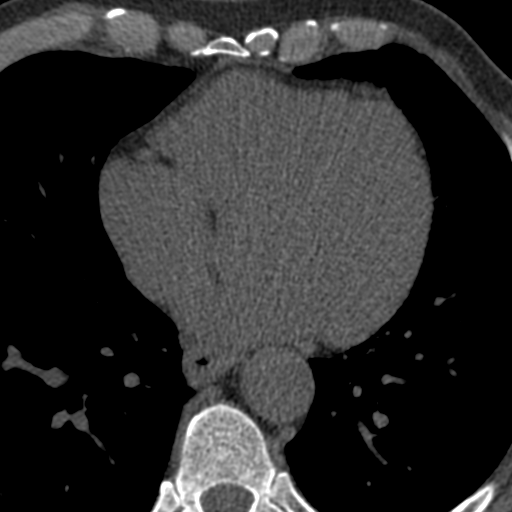
[im 48/80  vessel]
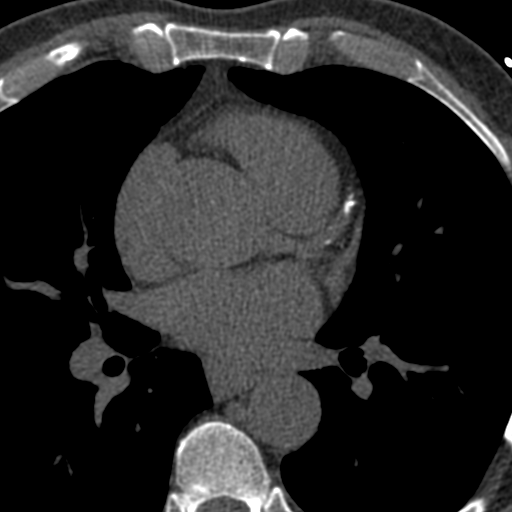
[im 64/80  vessel]
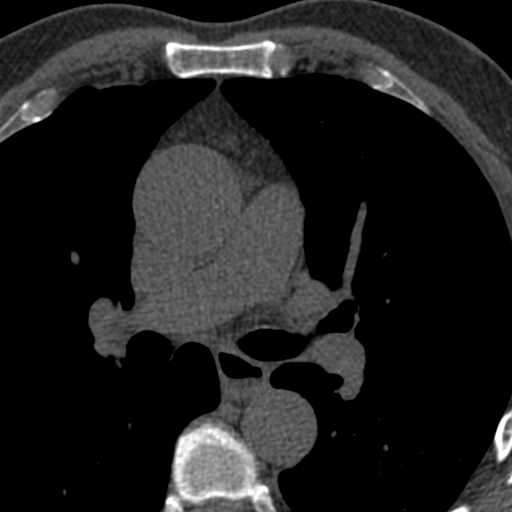

[Series 3: calcium scoring 2.00 br40 bestdiast 71% axial · axial · 0.65mm/px · z∈[+1483,+1587]mm · 5 of 80 slices shown, 7 images]
[im 14/80  vessel]
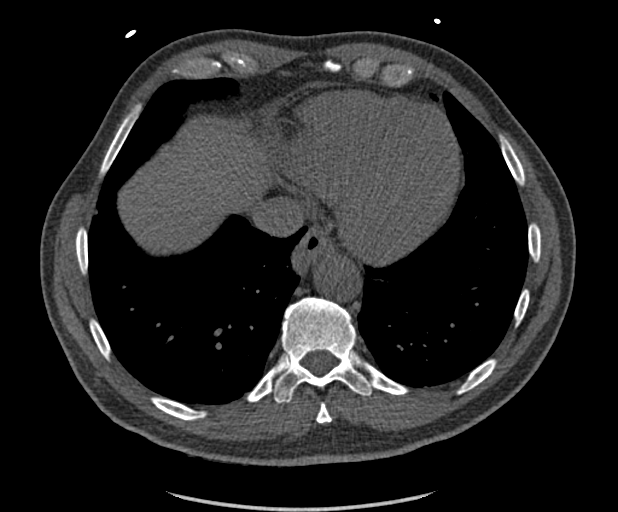
[im 14/80  lung]
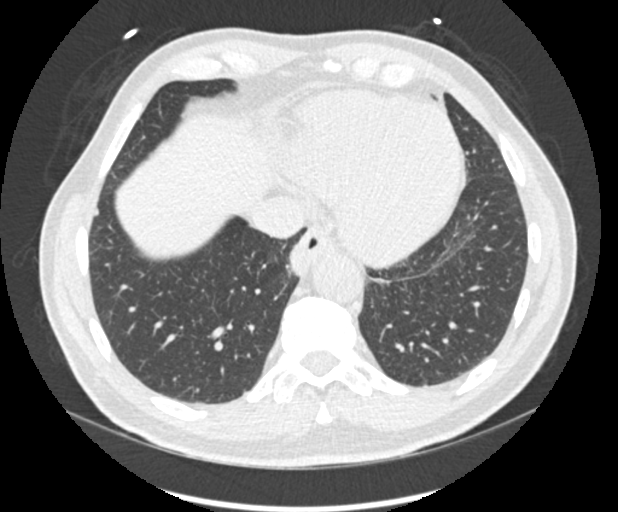
[im 27/80  vessel]
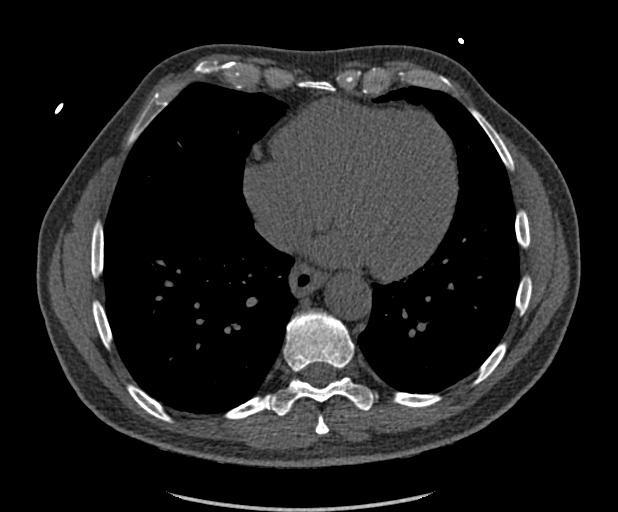
[im 40/80  vessel]
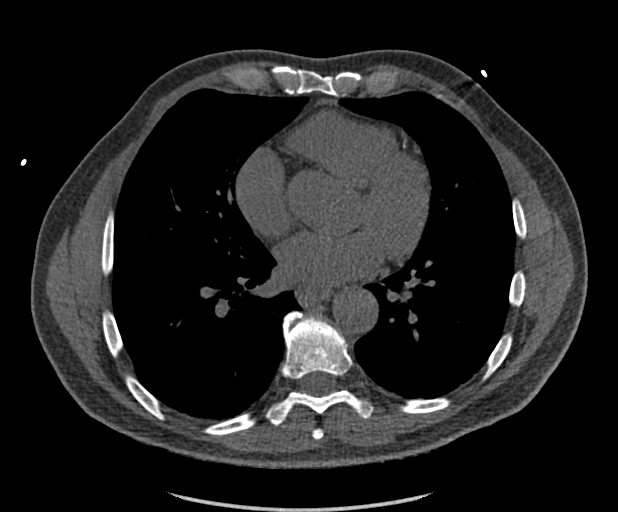
[im 53/80  vessel]
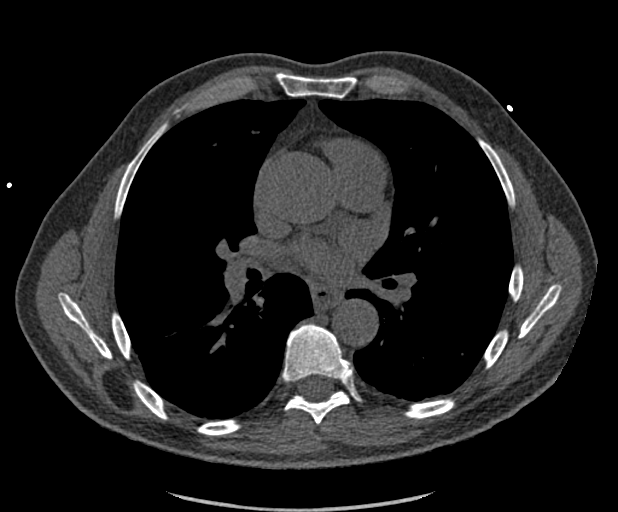
[im 66/80  vessel]
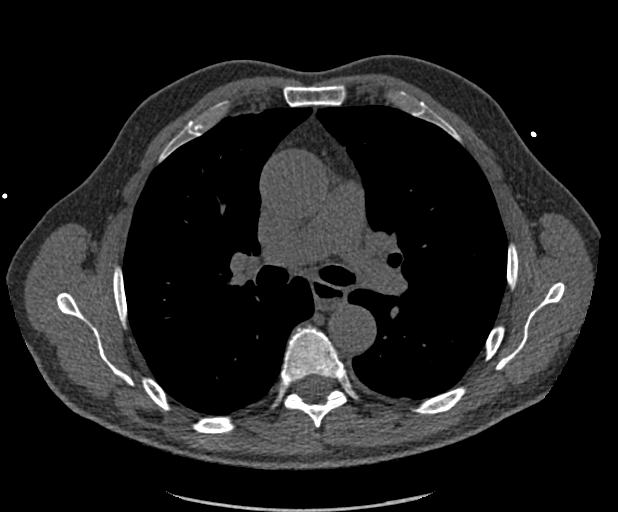
[im 66/80  lung]
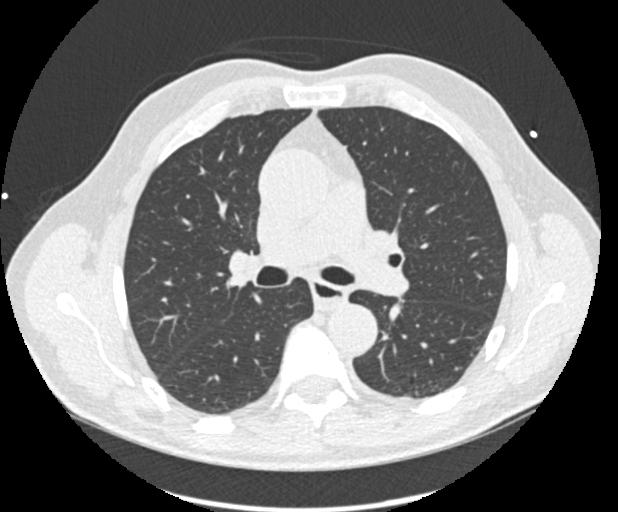

[Series 9: calcium scoring 2.00 br60 bestdiast 71% lungs · axial · 0.65mm/px · z∈[+1483,+1587]mm · 5 of 80 slices shown]
[im 14/80  vessel]
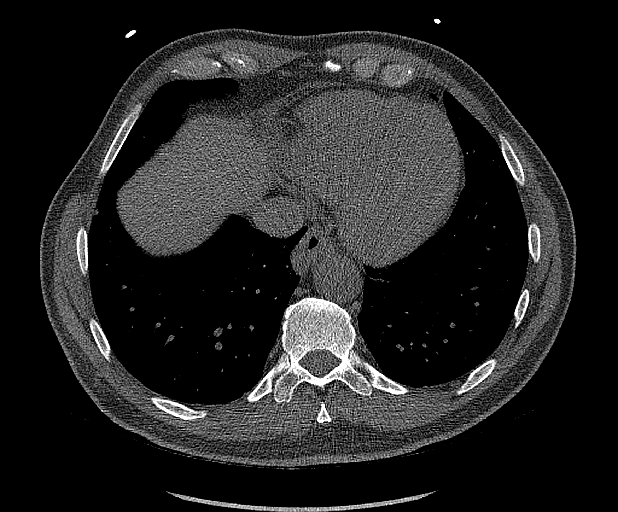
[im 27/80  vessel]
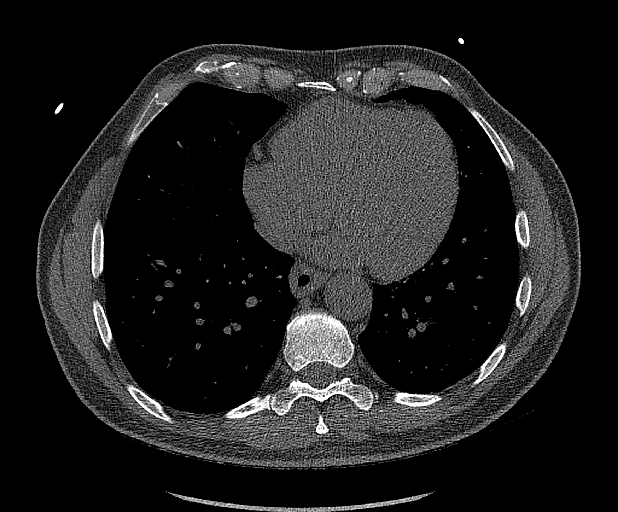
[im 40/80  vessel]
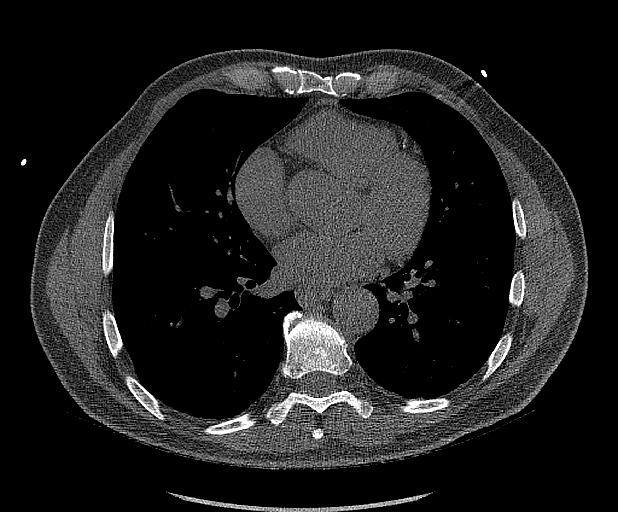
[im 53/80  vessel]
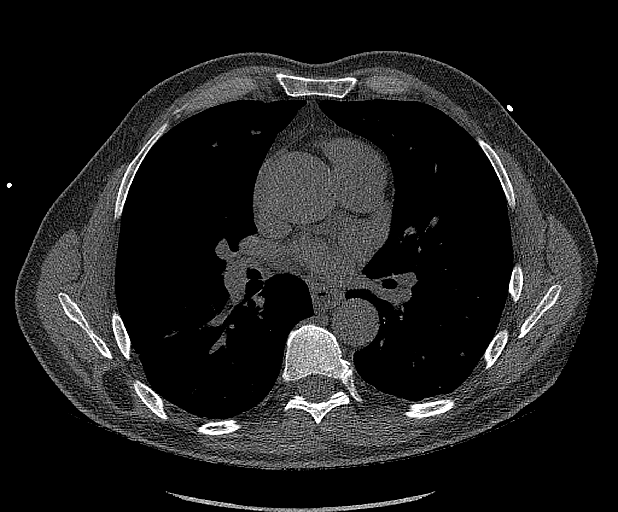
[im 66/80  vessel]
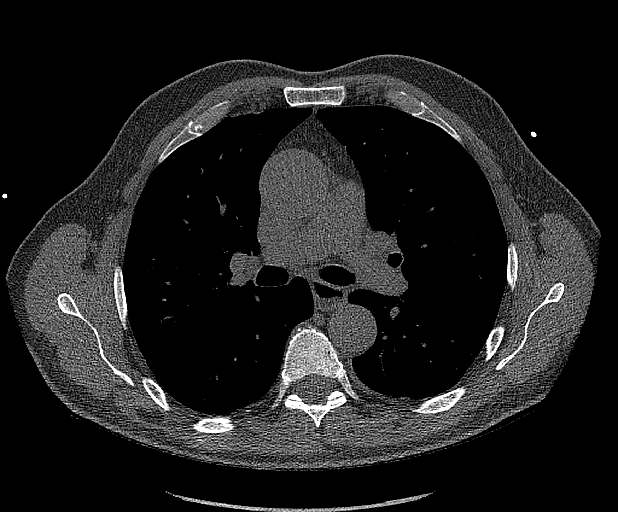

[14 of 20 positions shown; findings below may reference images not displayed]

FINDINGS: CORONARY CALCIUM SCORES:

Left Main: 0

LAD: 150

LCx: 2

RCA: 1

Total Agatston Score: 153

[HOSPITAL] percentile: 63

AORTA MEASUREMENTS:

Ascending Aorta: 45 mm

Descending Aorta: 29 mm

OTHER FINDINGS:

Heart is normal size. Aneurysmal dilatation of the ascending
thoracic aorta, 4.5 cm. No adenopathy. Clustered nodules
peripherally in the left lower lobe measuring up to 3 mm. Posterior
right lower lobe nodule measures 5 mm. No effusions. No acute
findings in the upper abdomen. Chest wall soft tissues are
unremarkable. No acute bony abnormality.
IMPRESSION: Total Agatston score: 153

[HOSPITAL] percentile: 63

4.5 cm ascending thoracic aortic aneurysm. Recommend semi-annual
imaging followup by CTA or MRA and referral to cardiothoracic
surgery if not already obtained. This recommendation follows 9838
ACCF/AHA/AATS/ACR/ASA/SCA/ATHIRAH/OAAVIM/LURA/CONKLIN Guidelines for the
Diagnosis and Management of Patients With Thoracic Aortic Disease.
Circulation. 9838; 121: E266-e369. Aortic aneurysm NOS (1MP73-ICQ.2)

Small bilateral lower lobe pulmonary nodules measuring up to 5 mm.
No follow-up needed if patient is low-risk (and has no known or
suspected primary neoplasm). Non-contrast chest CT can be considered
in 12 months if patient is high-risk. This recommendation follows
the consensus statement: Guidelines for Management of Incidental
Pulmonary Nodules Detected on CT Images: From the [HOSPITAL]

## 2024-07-17 ENCOUNTER — Ambulatory Visit (INDEPENDENT_AMBULATORY_CARE_PROVIDER_SITE_OTHER): Admitting: Otolaryngology

## 2024-07-29 ENCOUNTER — Encounter (INDEPENDENT_AMBULATORY_CARE_PROVIDER_SITE_OTHER): Payer: Self-pay | Admitting: Otolaryngology

## 2024-07-29 ENCOUNTER — Ambulatory Visit (INDEPENDENT_AMBULATORY_CARE_PROVIDER_SITE_OTHER): Admitting: Otolaryngology

## 2024-07-29 VITALS — BP 112/74 | HR 82 | Temp 98.2°F | Ht 74.0 in | Wt 204.0 lb

## 2024-07-29 DIAGNOSIS — H903 Sensorineural hearing loss, bilateral: Secondary | ICD-10-CM | POA: Diagnosis not present

## 2024-07-29 DIAGNOSIS — H6123 Impacted cerumen, bilateral: Secondary | ICD-10-CM | POA: Diagnosis not present

## 2024-07-29 NOTE — Progress Notes (Unsigned)
 Patient ID: [name] [name], male   DOB: [birth year], 68 y.o.   MRN: [id number]  Follow up: Hearing loss  HPI: 8/24 The patient is a 68 year old male who presents today complaining of progressive hearing loss for the past 2 years.  He has noted increasing difficulty hearing in noisy environments.  He denies any otalgia, otorrhea, or vertigo.  He denies any recent otitis media or otitis externa.  He has no previous otologic surgery.  He has never worn hearing aids.    The patient's review of systems (constitutional, eyes, ENT, cardiovascular, respiratory, GI, musculoskeletal, skin, neurologic, psychiatric, endocrine, hematologic, allergic) is noted in the ROS questionnaire.  It is reviewed with the patient.   Major events: Aortic aneurysm.     Ongoing medical problems: Aortic aneurysm.     Family health history: No HTN, DM, CAD, hearing loss or bleeding disorder.     Social history: The patient is married. He denies the use of tobacco or illegal drugs. He seldom drinks alcohol.     Objective Objective note General: Communicates without difficulty, well nourished, no acute distress. Head: Normocephalic, no evidence injury, no tenderness, facial buttresses intact without stepoff. Face/sinus: No tenderness to palpation and percussion. Facial movement is normal and symmetric. Eyes: PERRL, EOMI. No scleral icterus, conjunctivae clear. Neuro: CN II exam reveals vision grossly intact.  No nystagmus at any point of gaze. Ears: Auricles well formed without lesions.  Bilateral cerumen impaction.  Nose: External evaluation reveals normal support and skin without lesions.  Dorsum is intact.  Anterior rhinoscopy reveals congested mucosa over anterior aspect of inferior turbinates and intact septum.  No purulence noted. Oral:  Oral cavity and oropharynx are intact, symmetric, without erythema or edema.  Mucosa is moist without lesions. Neck: Full range of motion without pain.  There is no significant  lymphadenopathy.  No masses palpable.  Thyroid  bed within normal limits to palpation.  Parotid glands and submandibular glands equal bilaterally without mass.  Trachea is midline. Neuro:  CN 2-12 grossly intact.     Procedure: Bilateral cerumen disimpaction Anesthesia: None Description: Under the operating microscope, the cerumen is carefully removed with a combination of cerumen currette, alligator forceps, and suction catheters.  After the cerumen is removed, the TMs are noted to be normal.  No mass, erythema, or lesions. The patient tolerated the procedure well.     AUDIOMETRIC TESTING: I have read and reviewed the audiometric test, which shows bilateral high-frequency sensorineural hearing loss. The speech reception threshold is 40dB AD and 50dB AS. The discrimination score is 64% AD and 48% AS. The tympanogram is normal bilaterally.    Observations Functional status No functional status recorded  Cognitive status No cognitive status recorded  Assessment Assessment note 1.  Bilateral cerumen impaction.  After the disimpaction procedure, both tympanic membranes and middle ear spaces are noted to be normal.   2.  Bilateral high-frequency sensorineural hearing loss, likely secondary to routine presbycusis.    Screenings/Interventions/Assessments No screenings/interventions/assessments recorded  Diagnoses attached to encounter No diagnoses attached  Plan Plan note 1.  Otomicroscopy with bilateral cerumen disimpaction.   2.  The physical exam findings and the hearing test results are reviewed with the patient.   3.  The patient is a candidate for hearing amplification.  The hearing aid options are discussed.   4.  The patient will return for reevaluation in 1 year.

## 2024-07-30 DIAGNOSIS — H6123 Impacted cerumen, bilateral: Secondary | ICD-10-CM | POA: Insufficient documentation

## 2024-07-30 DIAGNOSIS — H903 Sensorineural hearing loss, bilateral: Secondary | ICD-10-CM | POA: Insufficient documentation

## 2024-09-08 ENCOUNTER — Other Ambulatory Visit: Payer: Self-pay | Admitting: Thoracic Surgery (Cardiothoracic Vascular Surgery)

## 2024-09-08 DIAGNOSIS — I7121 Aneurysm of the ascending aorta, without rupture: Secondary | ICD-10-CM

## 2024-10-12 ENCOUNTER — Ambulatory Visit (HOSPITAL_COMMUNITY)
Admission: RE | Admit: 2024-10-12 | Discharge: 2024-10-12 | Disposition: A | Discharge status: Home / Self Care | Source: Ambulatory Visit | Attending: Thoracic Surgery (Cardiothoracic Vascular Surgery) | Admitting: Thoracic Surgery (Cardiothoracic Vascular Surgery)

## 2024-10-12 DIAGNOSIS — I7121 Aneurysm of the ascending aorta, without rupture: Secondary | ICD-10-CM | POA: Insufficient documentation

## 2024-10-12 MED ORDER — IOHEXOL 350 MG/ML SOLN
75.0000 mL | Freq: Once | INTRAVENOUS | Status: AC | PRN
  Administered 2024-10-12: 75 mL via INTRAVENOUS

## 2024-10-16 ENCOUNTER — Other Ambulatory Visit (HOSPITAL_COMMUNITY)

## 2024-10-30 ENCOUNTER — Ambulatory Visit

## 2024-10-30 ENCOUNTER — Other Ambulatory Visit: Payer: Self-pay | Admitting: Thoracic Surgery (Cardiothoracic Vascular Surgery)

## 2024-10-30 VITALS — BP 123/79 | HR 57 | Resp 18 | Ht 74.0 in

## 2024-10-30 DIAGNOSIS — I7121 Aneurysm of the ascending aorta, without rupture: Secondary | ICD-10-CM | POA: Insufficient documentation

## 2024-10-30 DIAGNOSIS — R911 Solitary pulmonary nodule: Secondary | ICD-10-CM | POA: Diagnosis present

## 2024-10-30 NOTE — Patient Instructions (Signed)
-  Will order PET scan for lung nodule -Follow up with Dr. Kerrin in office after this scan has been done  -Follow up in 6 months with CTA of chest for continued surveillance of ascending thoracic aortic aneurysm

## 2024-10-30 NOTE — Progress Notes (Signed)
 "      [address] [place] [address]             [phone]            [name] [name] [id number] 1956/05/25   History of Present Illness:  [name] is a 69 year old man with medical history of hypertension, BPH, hyperlipidemia, and essential thrombocythemia presents for continued surveillance of this ascending thoracic aortic aneurysm. Aneurysm was found in 2023 on a calcium score CT scan. Since he has been screening with CTA of the chest.  On recent CTA of chest aneurysm has measured stable in size at 4.5 cm. He has had an echocardiogram on 10/2022 which showed normal aortic valve with mild aortic regurgitation.  CT scan also showed that left lung nodule has increased in size. In 2024 it measured 0.7 cm and on most recent scan measured 1.1 cm.   He has been doing well.  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  He denies heavy lifting and uses proper equipment to move heavier items. His blood pressure is well controled with current medications. His father had an ascending thoracic aortic aneurysm at the age of 70 which was repaired. He denies chest pain, shortness of breath, chest tightness, weight loss, and lower leg swelling.    Medications Ordered Prior to Encounter[1]   ROS: Review of Systems  Constitutional: Negative.  Negative for fever, malaise/fatigue and weight loss.  Respiratory: Negative.  Negative for cough and shortness of breath.   Cardiovascular: Negative.  Negative for chest pain, palpitations and leg swelling.  Neurological: Negative.  Negative for dizziness and headaches.     BP 123/79   Pulse (!) 57   Resp 18   Ht 6' 2 (1.88 m)   SpO2 98%   BMI 26.19 kg/m    Physical Exam Constitutional:      Appearance: Normal appearance.  HENT:     Head: Normocephalic and atraumatic.  Skin:    General: Skin is warm and dry.  Neurological:     General: No focal deficit present.     Mental Status: He is alert and  oriented to person, place, and time.      Imaging: CLINICAL DATA:  69 year old male with history of thoracic aortic aneurysm. Follow-up study.   EXAM: CT ANGIOGRAPHY CHEST WITH CONTRAST   TECHNIQUE: Multidetector CT imaging of the chest was performed using the standard protocol during bolus administration of intravenous contrast. Multiplanar CT image reconstructions and MIPs were obtained to evaluate the vascular anatomy.   RADIATION DOSE REDUCTION: This exam was performed according to the departmental dose-optimization program which includes automated exposure control, adjustment of the mA and/or kV according to patient size and/or use of iterative reconstruction technique.   CONTRAST:  Seventy-five mL Omnipaque  350, intravenous   COMPARISON:  04/21/2024, 10/23/2022   FINDINGS: Cardiovascular: Preferential opacification of the thoracic aorta. No evidence of thoracic aortic dissection. Mild global cardiomegaly. No pericardial effusion. The heart is normal in size. No pericardial effusion.   Sinues of Valsalva: 38 mm 35 x 37 mm ,unchanged   Sinotubular Junction: 37 mm ,unchanged   Ascending Aorta: 45 mm ,unchanged   Aortic Arch: 37 mm ,unchanged   Descending aorta: 31 mm at the level of the carina ,unchanged   Branch vessels: Conventional branching pattern. No significant atherosclerotic changes.   Coronary arteries: Normal origins and courses. Proximal atherosclerotic calcifications about the left  anterior descending coronary artery.   Main pulmonary artery: 30 mm ,unchanged. No evidence of central pulmonary embolism.   Pulmonary veins: No anomalous pulmonary venous return. No evidence of left atrial appendage thrombus.   Upper abdominal vasculature: Within normal limits.   Mediastinum/Nodes: No enlarged mediastinal, hilar, or axillary lymph nodes. Thyroid  gland, trachea, and esophagus demonstrate no significant findings.   Lungs/Pleura: Minimal interval  enlargement of previously visualized solid pulmonary nodule in the lingula (series 302, image 76) which measures up to 1.1 cm, however enlarged from 0.7 cm on 10/23/2022 comparison. There are few additional scattered subcentimeter solid pulmonary nodules for example in the posterior right lower lobe (series 302, image 67). No focal consolidations, pleural effusion, or pneumothorax.   Upper Abdomen: Indeterminate soft tissue density mass just posterior and inferior to the right adrenal gland measuring approximately 1.1 x 2.7 x 3.2 cm (AP by trans by cc) which appears slightly more conspicuous and minimally enlarged from most recent comparison however partially visualized on 10/23/2022 comparison. Small hiatal hernia. The remaining visualized upper abdomen is within normal limits.   Musculoskeletal: No chest wall abnormality. No acute or significant osseous findings.   Review of the MIP images confirms the above findings.   IMPRESSION: Vascular:   1. Stable fusiform ascending thoracic aortic aneurysm measuring up to 4.5 cm. Recommend semi-annual imaging followup by CTA or MRA and referral to cardiothoracic surgery if not already obtained. This recommendation follows 2010 ACCF/AHA/AATS/ACR/ASA/SCA/SCAI/SIR/STS/SVM Guidelines for the Diagnosis and Management of Patients With Thoracic Aortic Disease. Circulation. 2010; 121: Z733-z630. Aortic aneurysm NOS (ICD10-I71.9) 2. Coronary atherosclerotic calcifications. 3. Mild global cardiomegaly.   Non-Vascular:   1. Minimal enlargement of previously visualized solid pulmonary nodule in the lingula measuring up to 1.1 cm, 1.0 cm on most recent comparison (04/21/2024) and 0.7 cm 2 years ago (10/23/2022). There are few additional scattered stable subcentimeter solid pulmonary nodules. Given gradual interval enlargement, PET CT for further characterization could be considered. 2. Increased conspicuity and size of indeterminate soft  tissue density mass adjacent and inferior to the right adrenal gland which measures up to approximately 3.2 cm, favored represent lymphadenopathy of indeterminate etiology. PET-CT could also be considered for further characterization of this finding, particularly in light of prior workup for myelodysplastic syndrome. 3. Small hiatal hernia, similar to comparison.   Ester Sides, MD   [facility]   [facility]     Electronically Signed   By: Ester Sides M.D.   On: 10/12/2024 15:31     A/P: Aneurysm of ascending aorta without rupture -4.5 cm ascending thoracic aortic aneurysm on CTA of chest. echocardiogram on 10/2022 which showed normal aortic valve with mild aortic regurgitation. -We discussed the natural history and and risk factors for growth of ascending aortic aneurysms. Discussed recommendations to minimize the risk of further expansion or dissection including careful blood pressure control, avoidance of contact sports and heavy lifting, attention to lipid management.  We covered the importance of staying never user of tobacco.  The patient does not yet meet surgical criteria of >5.5cm. The patient is aware of signs and symptoms of aortic dissection and when to present to the emergency department  -Follow up in 6 months with CTA of chest for continued surveillance   Lung nodule -left lobe lung nodule in lingula has increased in size and now measures 1.1 cm -PET CT scan ordered for further characterization of lung nodule -After scan obtained office visit with Dr. Kerrin will be scheduled  Risk Modification:  Statin:  rosuvastatin  Smoking cessation instruction/counseling given:  never user  Patient was counseled on importance of Blood Pressure Control  They are instructed to contact their Primary Care Physician if they start to have blood pressure readings over 130s/90s. Do not ever stop blood pressure medications  on your own, unless instructed by healthcare professional.  Please avoid use of Fluoroquinolones as this can potentially increase your risk of Aortic Rupture and/or Dissection  Patient educated on signs and symptoms of Aortic Dissection, handout also provided in AVS  Manuelita CHRISTELLA Rough, PA-C 10/30/24     [1]  Current Outpatient Medications on File Prior to Visit  Medication Sig Dispense Refill   aspirin EC 81 MG tablet Take 81 mg by mouth daily. Swallow whole.     Cholecalciferol (VITAMIN D-3) 125 MCG (5000 UT) TABS Take 2 tablets by mouth daily at 12 noon.     GLUCOSAMINE HCL PO Take by mouth.     hydroxyurea (HYDREA) 500 MG capsule Take 500 mg by mouth daily. May take with food to minimize GI side effects.     losartan (COZAAR) 100 MG tablet Take 100 mg by mouth daily.     magnesium gluconate (MAGONATE) 500 MG tablet Take 500 mg by mouth.     rosuvastatin (CRESTOR) 10 MG tablet 1 tablet     No current facility-administered medications on file prior to visit.   "

## 2024-11-13 ENCOUNTER — Ambulatory Visit (HOSPITAL_COMMUNITY)

## 2024-11-17 ENCOUNTER — Ambulatory Visit: Admitting: Thoracic Surgery (Cardiothoracic Vascular Surgery)
